# Patient Record
Sex: Female | Born: 1940
Health system: Southern US, Community
[De-identification: ages and names within clinical notes are randomized; demographics above are authoritative.]

## PROBLEM LIST (undated history)

## (undated) DIAGNOSIS — G4730 Sleep apnea, unspecified: Secondary | ICD-10-CM

## (undated) DIAGNOSIS — M1990 Unspecified osteoarthritis, unspecified site: Secondary | ICD-10-CM

## (undated) DIAGNOSIS — K219 Gastro-esophageal reflux disease without esophagitis: Secondary | ICD-10-CM

## (undated) DIAGNOSIS — I10 Essential (primary) hypertension: Secondary | ICD-10-CM

## (undated) DIAGNOSIS — K429 Umbilical hernia without obstruction or gangrene: Secondary | ICD-10-CM

## (undated) HISTORY — PX: ABDOMINAL HYSTERECTOMY: SHX81

## (undated) HISTORY — PX: EYE SURGERY: SHX253

---

## 1984-10-05 HISTORY — PX: FOOT SURGERY: SHX648

## 1998-01-28 ENCOUNTER — Other Ambulatory Visit: Admission: RE | Admit: 1998-01-28 | Discharge: 1998-01-28 | Payer: Self-pay | Admitting: Internal Medicine

## 1998-01-28 ENCOUNTER — Other Ambulatory Visit: Admission: RE | Admit: 1998-01-28 | Discharge: 1998-01-28 | Payer: Self-pay | Admitting: Urology

## 1998-03-07 ENCOUNTER — Ambulatory Visit (HOSPITAL_COMMUNITY): Admission: RE | Admit: 1998-03-07 | Discharge: 1998-03-07 | Payer: Self-pay | Admitting: Gastroenterology

## 1998-03-28 ENCOUNTER — Ambulatory Visit (HOSPITAL_COMMUNITY): Admission: RE | Admit: 1998-03-28 | Discharge: 1998-03-28 | Payer: Self-pay | Admitting: Gastroenterology

## 1999-01-09 ENCOUNTER — Ambulatory Visit (HOSPITAL_COMMUNITY): Admission: RE | Admit: 1999-01-09 | Discharge: 1999-01-09 | Payer: Self-pay | Admitting: Gastroenterology

## 2000-05-25 ENCOUNTER — Encounter: Admission: RE | Admit: 2000-05-25 | Discharge: 2000-05-25 | Payer: Self-pay | Admitting: Internal Medicine

## 2000-05-25 ENCOUNTER — Encounter: Payer: Self-pay | Admitting: Internal Medicine

## 2001-06-01 ENCOUNTER — Encounter: Payer: Self-pay | Admitting: Internal Medicine

## 2001-06-01 ENCOUNTER — Encounter: Admission: RE | Admit: 2001-06-01 | Discharge: 2001-06-01 | Payer: Self-pay | Admitting: Internal Medicine

## 2002-11-06 ENCOUNTER — Encounter: Admission: RE | Admit: 2002-11-06 | Discharge: 2002-11-06 | Payer: Self-pay | Admitting: Internal Medicine

## 2002-11-06 ENCOUNTER — Encounter: Payer: Self-pay | Admitting: Internal Medicine

## 2003-12-03 ENCOUNTER — Encounter: Admission: RE | Admit: 2003-12-03 | Discharge: 2003-12-03 | Payer: Self-pay | Admitting: Internal Medicine

## 2003-12-07 ENCOUNTER — Encounter: Admission: RE | Admit: 2003-12-07 | Discharge: 2003-12-07 | Payer: Self-pay | Admitting: Internal Medicine

## 2004-01-03 ENCOUNTER — Ambulatory Visit (HOSPITAL_COMMUNITY): Admission: RE | Admit: 2004-01-03 | Discharge: 2004-01-03 | Payer: Self-pay | Admitting: Gastroenterology

## 2005-02-09 ENCOUNTER — Encounter: Admission: RE | Admit: 2005-02-09 | Discharge: 2005-02-09 | Payer: Self-pay | Admitting: Obstetrics and Gynecology

## 2006-02-10 ENCOUNTER — Encounter: Admission: RE | Admit: 2006-02-10 | Discharge: 2006-02-10 | Payer: Self-pay | Admitting: Internal Medicine

## 2006-07-10 ENCOUNTER — Encounter: Admission: RE | Admit: 2006-07-10 | Discharge: 2006-07-10 | Payer: Self-pay | Admitting: Orthopedic Surgery

## 2006-11-10 ENCOUNTER — Ambulatory Visit: Payer: Self-pay | Admitting: Oncology

## 2006-12-15 ENCOUNTER — Encounter: Admission: RE | Admit: 2006-12-15 | Discharge: 2006-12-15 | Payer: Self-pay | Admitting: Internal Medicine

## 2007-03-21 ENCOUNTER — Ambulatory Visit: Payer: Self-pay | Admitting: Oncology

## 2007-03-23 LAB — CBC & DIFF AND RETIC
Basophils Absolute: 0.1 10*3/uL (ref 0.0–0.1)
Eosinophils Absolute: 0.1 10*3/uL (ref 0.0–0.5)
HGB: 15 g/dL (ref 11.6–15.9)
IRF: 0.3 (ref 0.130–0.330)
LYMPH%: 20.8 % (ref 14.0–48.0)
MCH: 33.2 pg (ref 26.0–34.0)
MCHC: 34.9 g/dL (ref 32.0–36.0)
MONO#: 0.5 10*3/uL (ref 0.1–0.9)
NEUT#: 7 10*3/uL — ABNORMAL HIGH (ref 1.5–6.5)
NEUT%: 71.6 % (ref 39.6–76.8)
Platelets: 442 10*3/uL — ABNORMAL HIGH (ref 145–400)
Retic %: 1 % (ref 0.4–2.3)
lymph#: 2 10*3/uL (ref 0.9–3.3)

## 2007-03-23 LAB — COMPREHENSIVE METABOLIC PANEL
Albumin: 4.7 g/dL (ref 3.5–5.2)
Alkaline Phosphatase: 68 U/L (ref 39–117)
BUN: 13 mg/dL (ref 6–23)
CO2: 26 mEq/L (ref 19–32)
Calcium: 9.6 mg/dL (ref 8.4–10.5)

## 2007-03-23 LAB — LACTATE DEHYDROGENASE: LDH: 151 U/L (ref 94–250)

## 2007-03-23 LAB — IRON AND TIBC
Iron: 58 ug/dL (ref 42–145)
TIBC: 294 ug/dL (ref 250–470)
UIBC: 236 ug/dL

## 2007-03-29 ENCOUNTER — Encounter: Admission: RE | Admit: 2007-03-29 | Discharge: 2007-03-29 | Payer: Self-pay | Admitting: Internal Medicine

## 2008-03-29 ENCOUNTER — Encounter: Admission: RE | Admit: 2008-03-29 | Discharge: 2008-03-29 | Payer: Self-pay | Admitting: Internal Medicine

## 2009-04-16 ENCOUNTER — Encounter: Admission: RE | Admit: 2009-04-16 | Discharge: 2009-04-16 | Payer: Self-pay | Admitting: Internal Medicine

## 2010-05-28 ENCOUNTER — Encounter: Admission: RE | Admit: 2010-05-28 | Discharge: 2010-05-28 | Payer: Self-pay | Admitting: Internal Medicine

## 2011-02-20 NOTE — Op Note (Signed)
NAME:  Rachel Ballard, Rachel Ballard                           ACCOUNT NO.:  [id number]   MEDICAL RECORD NO.:  [id number]                   PATIENT TYPE:  AMB   LOCATION:  ENDO                                 FACILITY:  [facility]   PHYSICIAN:  Danise Edge, M.D.                DATE OF BIRTH:  09/29/1941   DATE OF PROCEDURE:  01/03/2004  DATE OF DISCHARGE:                                 OPERATIVE REPORT   PROCEDURE:  Screening colonoscopy.   INDICATIONS FOR PROCEDURE:  [name] is a 70 year old female born  January 12, 1941.  Ms. Amster has a history of neoplastic colon polyps removed  colonoscopically.  She is due for a screening colonoscopy with polypectomy  to prevent colon cancer.   ENDOSCOPIST:  Danise Edge, M.D.   PREMEDICATION:  Versed 5 mg, Demerol 50 mg.   DESCRIPTION OF PROCEDURE:  After obtaining informed consent, Ms. Spruce was  placed in the left lateral decubitus position. I administered intravenous  Demerol and intravenous Versed to achieve conscious sedation for the  procedure. The patient's blood pressure, oxygen saturation and cardiac  rhythm were monitored throughout the procedure and documented in the medical  record.   Anal inspection was normal. Digital rectal exam was normal. The Olympus  adjustable pediatric colonoscope was introduced into the rectum and advanced  to the cecum. Colonic preparation for the exam today was excellent.   RECTUM:  Normal.   SIGMOID COLON AND DESCENDING COLON:  Normal.   SPLENIC FLEXURE:  Normal.   TRANSVERSE COLON:  Normal.   HEPATIC FLEXURE:  Normal.   ASCENDING COLON:  Normal.   CECUM AND ILEOCECAL VALVE:  Normal.   ASSESSMENT:  Normal screening proctocolonoscopy to the cecum.  No endoscopic  evidence for the presence of colorectal neoplasia.   RECOMMENDATIONS:  Repeat colonoscopy in five years.                                               Danise Edge, M.D.    MJ/MEDQ  D:  01/03/2004  T:  01/03/2004  Job:   [id number]   cc:   Ike Bene, M.D.  [address]  [place]  [place] [address]  Fax: [phone]

## 2011-06-26 ENCOUNTER — Other Ambulatory Visit: Payer: Self-pay | Admitting: Internal Medicine

## 2011-06-26 DIAGNOSIS — Z1231 Encounter for screening mammogram for malignant neoplasm of breast: Secondary | ICD-10-CM

## 2011-06-30 ENCOUNTER — Ambulatory Visit: Payer: Self-pay

## 2011-07-14 ENCOUNTER — Ambulatory Visit
Admission: RE | Admit: 2011-07-14 | Discharge: 2011-07-14 | Disposition: A | Discharge status: Home / Self Care | Payer: BLUE CROSS/BLUE SHIELD | Source: Ambulatory Visit | Attending: Internal Medicine | Admitting: Internal Medicine

## 2011-07-14 DIAGNOSIS — Z1231 Encounter for screening mammogram for malignant neoplasm of breast: Secondary | ICD-10-CM

## 2011-07-15 ENCOUNTER — Ambulatory Visit: Payer: Self-pay

## 2011-11-03 DIAGNOSIS — R7989 Other specified abnormal findings of blood chemistry: Secondary | ICD-10-CM | POA: Diagnosis not present

## 2011-12-31 DIAGNOSIS — R7989 Other specified abnormal findings of blood chemistry: Secondary | ICD-10-CM | POA: Diagnosis not present

## 2012-03-24 DIAGNOSIS — H2640 Unspecified secondary cataract: Secondary | ICD-10-CM | POA: Diagnosis not present

## 2012-03-24 DIAGNOSIS — Z961 Presence of intraocular lens: Secondary | ICD-10-CM | POA: Diagnosis not present

## 2012-03-24 DIAGNOSIS — D485 Neoplasm of uncertain behavior of skin: Secondary | ICD-10-CM | POA: Diagnosis not present

## 2012-03-24 DIAGNOSIS — H259 Unspecified age-related cataract: Secondary | ICD-10-CM | POA: Diagnosis not present

## 2012-04-15 DIAGNOSIS — M25519 Pain in unspecified shoulder: Secondary | ICD-10-CM | POA: Diagnosis not present

## 2012-04-15 DIAGNOSIS — I10 Essential (primary) hypertension: Secondary | ICD-10-CM | POA: Diagnosis not present

## 2012-04-15 DIAGNOSIS — L989 Disorder of the skin and subcutaneous tissue, unspecified: Secondary | ICD-10-CM | POA: Diagnosis not present

## 2012-04-15 DIAGNOSIS — E663 Overweight: Secondary | ICD-10-CM | POA: Diagnosis not present

## 2012-04-15 DIAGNOSIS — G4730 Sleep apnea, unspecified: Secondary | ICD-10-CM | POA: Diagnosis not present

## 2012-04-26 DIAGNOSIS — L820 Inflamed seborrheic keratosis: Secondary | ICD-10-CM | POA: Diagnosis not present

## 2012-04-26 DIAGNOSIS — D485 Neoplasm of uncertain behavior of skin: Secondary | ICD-10-CM | POA: Diagnosis not present

## 2012-05-16 DIAGNOSIS — L821 Other seborrheic keratosis: Secondary | ICD-10-CM | POA: Diagnosis not present

## 2012-05-16 DIAGNOSIS — L820 Inflamed seborrheic keratosis: Secondary | ICD-10-CM | POA: Diagnosis not present

## 2012-05-16 DIAGNOSIS — D485 Neoplasm of uncertain behavior of skin: Secondary | ICD-10-CM | POA: Diagnosis not present

## 2012-08-09 ENCOUNTER — Other Ambulatory Visit: Payer: Self-pay | Admitting: Internal Medicine

## 2012-08-09 DIAGNOSIS — Z1231 Encounter for screening mammogram for malignant neoplasm of breast: Secondary | ICD-10-CM

## 2012-09-12 ENCOUNTER — Ambulatory Visit
Admission: RE | Admit: 2012-09-12 | Discharge: 2012-09-12 | Disposition: A | Discharge status: Home / Self Care | Payer: Medicare Other | Source: Ambulatory Visit | Attending: Internal Medicine | Admitting: Internal Medicine

## 2012-09-12 ENCOUNTER — Ambulatory Visit: Payer: BLUE CROSS/BLUE SHIELD

## 2012-09-12 DIAGNOSIS — Z1231 Encounter for screening mammogram for malignant neoplasm of breast: Secondary | ICD-10-CM | POA: Diagnosis not present

## 2012-11-24 DIAGNOSIS — E7800 Pure hypercholesterolemia, unspecified: Secondary | ICD-10-CM | POA: Diagnosis not present

## 2012-11-24 DIAGNOSIS — Z0000 Encounter for general adult medical examination without abnormal findings: Secondary | ICD-10-CM | POA: Diagnosis not present

## 2012-11-24 DIAGNOSIS — G4730 Sleep apnea, unspecified: Secondary | ICD-10-CM | POA: Diagnosis not present

## 2012-11-24 DIAGNOSIS — I10 Essential (primary) hypertension: Secondary | ICD-10-CM | POA: Diagnosis not present

## 2013-03-21 DIAGNOSIS — H43819 Vitreous degeneration, unspecified eye: Secondary | ICD-10-CM | POA: Diagnosis not present

## 2013-03-21 DIAGNOSIS — H259 Unspecified age-related cataract: Secondary | ICD-10-CM | POA: Diagnosis not present

## 2013-03-21 DIAGNOSIS — H02839 Dermatochalasis of unspecified eye, unspecified eyelid: Secondary | ICD-10-CM | POA: Diagnosis not present

## 2013-03-21 DIAGNOSIS — H52209 Unspecified astigmatism, unspecified eye: Secondary | ICD-10-CM | POA: Diagnosis not present

## 2013-04-20 DIAGNOSIS — H2640 Unspecified secondary cataract: Secondary | ICD-10-CM | POA: Diagnosis not present

## 2013-04-20 DIAGNOSIS — H26499 Other secondary cataract, unspecified eye: Secondary | ICD-10-CM | POA: Diagnosis not present

## 2013-05-12 DIAGNOSIS — N390 Urinary tract infection, site not specified: Secondary | ICD-10-CM | POA: Diagnosis not present

## 2013-05-22 DIAGNOSIS — I10 Essential (primary) hypertension: Secondary | ICD-10-CM | POA: Diagnosis not present

## 2013-05-22 DIAGNOSIS — G4730 Sleep apnea, unspecified: Secondary | ICD-10-CM | POA: Diagnosis not present

## 2013-05-22 DIAGNOSIS — R350 Frequency of micturition: Secondary | ICD-10-CM | POA: Diagnosis not present

## 2013-09-18 DIAGNOSIS — H579 Unspecified disorder of eye and adnexa: Secondary | ICD-10-CM | POA: Diagnosis not present

## 2013-09-18 DIAGNOSIS — H01009 Unspecified blepharitis unspecified eye, unspecified eyelid: Secondary | ICD-10-CM | POA: Diagnosis not present

## 2013-09-18 DIAGNOSIS — H04129 Dry eye syndrome of unspecified lacrimal gland: Secondary | ICD-10-CM | POA: Diagnosis not present

## 2013-09-18 DIAGNOSIS — H16109 Unspecified superficial keratitis, unspecified eye: Secondary | ICD-10-CM | POA: Diagnosis not present

## 2013-10-12 ENCOUNTER — Other Ambulatory Visit: Payer: Self-pay

## 2013-10-12 DIAGNOSIS — Z1231 Encounter for screening mammogram for malignant neoplasm of breast: Secondary | ICD-10-CM

## 2013-11-02 ENCOUNTER — Ambulatory Visit
Admission: RE | Admit: 2013-11-02 | Discharge: 2013-11-02 | Disposition: A | Discharge status: Home / Self Care | Payer: Medicare Other | Source: Ambulatory Visit

## 2013-11-02 DIAGNOSIS — Z1231 Encounter for screening mammogram for malignant neoplasm of breast: Secondary | ICD-10-CM | POA: Diagnosis not present

## 2013-11-27 DIAGNOSIS — G4730 Sleep apnea, unspecified: Secondary | ICD-10-CM | POA: Diagnosis not present

## 2013-11-27 DIAGNOSIS — I10 Essential (primary) hypertension: Secondary | ICD-10-CM | POA: Diagnosis not present

## 2013-11-27 DIAGNOSIS — Z0000 Encounter for general adult medical examination without abnormal findings: Secondary | ICD-10-CM | POA: Diagnosis not present

## 2013-11-27 DIAGNOSIS — E2839 Other primary ovarian failure: Secondary | ICD-10-CM | POA: Diagnosis not present

## 2013-11-27 DIAGNOSIS — K5900 Constipation, unspecified: Secondary | ICD-10-CM | POA: Diagnosis not present

## 2013-11-27 DIAGNOSIS — E785 Hyperlipidemia, unspecified: Secondary | ICD-10-CM | POA: Diagnosis not present

## 2013-12-13 DIAGNOSIS — E2839 Other primary ovarian failure: Secondary | ICD-10-CM | POA: Diagnosis not present

## 2013-12-13 DIAGNOSIS — M949 Disorder of cartilage, unspecified: Secondary | ICD-10-CM | POA: Diagnosis not present

## 2013-12-13 DIAGNOSIS — M899 Disorder of bone, unspecified: Secondary | ICD-10-CM | POA: Diagnosis not present

## 2013-12-27 DIAGNOSIS — E7800 Pure hypercholesterolemia, unspecified: Secondary | ICD-10-CM | POA: Diagnosis not present

## 2014-05-07 ENCOUNTER — Other Ambulatory Visit: Payer: Self-pay | Admitting: Gastroenterology

## 2014-09-03 ENCOUNTER — Encounter (HOSPITAL_COMMUNITY): Payer: Self-pay | Admitting: *Deleted

## 2014-09-17 ENCOUNTER — Other Ambulatory Visit: Payer: Self-pay | Admitting: Gastroenterology

## 2014-09-18 ENCOUNTER — Ambulatory Visit (HOSPITAL_COMMUNITY): Payer: Medicare Other | Admitting: Anesthesiology

## 2014-09-18 ENCOUNTER — Encounter (HOSPITAL_COMMUNITY)
Admission: RE | Disposition: A | Discharge status: Home / Self Care | Payer: Self-pay | Source: Ambulatory Visit | Attending: Gastroenterology

## 2014-09-18 ENCOUNTER — Ambulatory Visit (HOSPITAL_COMMUNITY)
Admission: RE | Admit: 2014-09-18 | Discharge: 2014-09-18 | Disposition: A | Discharge status: Home / Self Care | Payer: Medicare Other | Source: Ambulatory Visit | Attending: Gastroenterology | Admitting: Gastroenterology

## 2014-09-18 ENCOUNTER — Encounter (HOSPITAL_COMMUNITY): Payer: Self-pay

## 2014-09-18 DIAGNOSIS — E780 Pure hypercholesterolemia: Secondary | ICD-10-CM | POA: Insufficient documentation

## 2014-09-18 DIAGNOSIS — D126 Benign neoplasm of colon, unspecified: Secondary | ICD-10-CM | POA: Diagnosis not present

## 2014-09-18 DIAGNOSIS — I10 Essential (primary) hypertension: Secondary | ICD-10-CM | POA: Insufficient documentation

## 2014-09-18 DIAGNOSIS — M1990 Unspecified osteoarthritis, unspecified site: Secondary | ICD-10-CM | POA: Insufficient documentation

## 2014-09-18 DIAGNOSIS — Z1211 Encounter for screening for malignant neoplasm of colon: Secondary | ICD-10-CM | POA: Diagnosis not present

## 2014-09-18 DIAGNOSIS — G4733 Obstructive sleep apnea (adult) (pediatric): Secondary | ICD-10-CM | POA: Insufficient documentation

## 2014-09-18 DIAGNOSIS — Z86010 Personal history of colonic polyps: Secondary | ICD-10-CM | POA: Diagnosis not present

## 2014-09-18 DIAGNOSIS — D125 Benign neoplasm of sigmoid colon: Secondary | ICD-10-CM | POA: Insufficient documentation

## 2014-09-18 DIAGNOSIS — K219 Gastro-esophageal reflux disease without esophagitis: Secondary | ICD-10-CM | POA: Insufficient documentation

## 2014-09-18 DIAGNOSIS — Z09 Encounter for follow-up examination after completed treatment for conditions other than malignant neoplasm: Secondary | ICD-10-CM | POA: Diagnosis present

## 2014-09-18 HISTORY — DX: Unspecified osteoarthritis, unspecified site: M19.90

## 2014-09-18 HISTORY — PX: COLONOSCOPY WITH PROPOFOL: SHX5780

## 2014-09-18 HISTORY — DX: Gastro-esophageal reflux disease without esophagitis: K21.9

## 2014-09-18 HISTORY — DX: Umbilical hernia without obstruction or gangrene: K42.9

## 2014-09-18 HISTORY — DX: Essential (primary) hypertension: I10

## 2014-09-18 HISTORY — DX: Sleep apnea, unspecified: G47.30

## 2014-09-18 SURGERY — COLONOSCOPY WITH PROPOFOL
Anesthesia: Monitor Anesthesia Care

## 2014-09-18 MED ORDER — LIDOCAINE HCL (CARDIAC) 20 MG/ML IV SOLN
INTRAVENOUS | Status: AC
  Filled 2014-09-18: qty 5

## 2014-09-18 MED ORDER — PROPOFOL INFUSION 10 MG/ML OPTIME
INTRAVENOUS | Status: DC | PRN
  Administered 2014-09-18: 120 ug/kg/min via INTRAVENOUS

## 2014-09-18 MED ORDER — PROPOFOL 10 MG/ML IV BOLUS
INTRAVENOUS | Status: AC
  Filled 2014-09-18: qty 20

## 2014-09-18 MED ORDER — LIDOCAINE HCL (CARDIAC) 20 MG/ML IV SOLN
INTRAVENOUS | Status: DC | PRN
  Administered 2014-09-18: 50 mg via INTRAVENOUS

## 2014-09-18 MED ORDER — PROPOFOL 10 MG/ML IV BOLUS
INTRAVENOUS | Status: DC | PRN
  Administered 2014-09-18: 20 mg via INTRAVENOUS
  Administered 2014-09-18: 50 mg via INTRAVENOUS

## 2014-09-18 MED ORDER — LACTATED RINGERS IV SOLN
INTRAVENOUS | Status: DC
  Administered 2014-09-18: 1000 mL via INTRAVENOUS

## 2014-09-18 SURGICAL SUPPLY — 22 items

## 2014-09-18 NOTE — H&P (Signed)
  Procedure: Surveillance colonoscopy. 05/02/2009 colonoscopy with removal of 3 small adenomatous colon polyps  History: The patient is a 73 year old female born July 11, 1941. She is scheduled to undergo a surveillance colonoscopy with polypectomy to prevent colon cancer.  Medication allergies: Penicillin. Mycin drugs.  Past medical history: Hypertension. Hypercholesterolemia. Obstructive sleep apnea syndrome. Urticaria. Cataracts. Hysterectomy. Cataract surgery. Right foot surgery. Intestinal surgery as a child. Rectocele surgery.  Exam: The patient is alert and lying comfortably on the endoscopy stretcher. Abdomen is soft and nontender to palpation. Lungs are clear to auscultation. Cardiac exam reveals a regular rhythm.  Plan: Proceed with surveillance colonoscopy

## 2014-09-18 NOTE — Anesthesia Postprocedure Evaluation (Signed)
Anesthesia Post Note  Patient: Rachel Ballard  Procedure(s) Performed: Procedure(s) (LRB): COLONOSCOPY WITH PROPOFOL (N/A)  Anesthesia type: MAC  Patient location: PACU  Post pain: Pain level controlled  Post assessment: Post-op Vital signs reviewed  Last Vitals: BP 142/76 mmHg  Pulse 73  Temp(Src) 36.7 C (Oral)  Resp 15  SpO2 99%  Post vital signs: Reviewed  Level of consciousness: awake  Complications: No apparent anesthesia complications

## 2014-09-18 NOTE — Anesthesia Preprocedure Evaluation (Addendum)
Anesthesia Evaluation  Patient identified by MRN, date of birth, ID band Patient awake    Reviewed: Allergy & Precautions, H&P , NPO status , Patient's Chart, lab work & pertinent test results  Airway Mallampati: II  TM Distance: >3 FB Neck ROM: Full    Dental no notable dental hx.    Pulmonary sleep apnea ,  breath sounds clear to auscultation  Pulmonary exam normal       Cardiovascular hypertension, Pt. on medications Rhythm:Regular Rate:Normal     Neuro/Psych negative neurological ROS  negative psych ROS   GI/Hepatic Neg liver ROS, GERD-  ,  Endo/Other  negative endocrine ROS  Renal/GU negative Renal ROS     Musculoskeletal  (+) Arthritis -,   Abdominal   Peds  Hematology negative hematology ROS (+)   Anesthesia Other Findings   Reproductive/Obstetrics negative OB ROS                            Anesthesia Physical Anesthesia Plan  ASA: II  Anesthesia Plan: MAC   Post-op Pain Management:    Induction: Intravenous  Airway Management Planned:   Additional Equipment:   Intra-op Plan:   Post-operative Plan:   Informed Consent: I have reviewed the patients History and Physical, chart, labs and discussed the procedure including the risks, benefits and alternatives for the proposed anesthesia with the patient or authorized representative who has indicated his/her understanding and acceptance.   Dental advisory given  Plan Discussed with: CRNA  Anesthesia Plan Comments:         Anesthesia Quick Evaluation

## 2014-09-18 NOTE — Transfer of Care (Signed)
Immediate Anesthesia Transfer of Care Note  Patient: Rachel Ballard  Procedure(s) Performed: Procedure(s): COLONOSCOPY WITH PROPOFOL (N/A)  Patient Location: PACU  Anesthesia Type:MAC  Level of Consciousness: awake, alert  and oriented  Airway & Oxygen Therapy: Patient Spontanous Breathing and Patient connected to face mask oxygen  Post-op Assessment: Report given to PACU RN and Post -op Vital signs reviewed and stable  Post vital signs: Reviewed and stable  Complications: No apparent anesthesia complications

## 2014-09-18 NOTE — Op Note (Signed)
Procedure: Surveillance colonoscopy. 05/02/2009 colonoscopy with removal of 3 small adenomatous colon polyps  Endoscopist: Earle Gell  Premedication: Propofol administered by anesthesia  Procedure: The patient was placed in the left lateral decubitus position. Anal inspection and digital rectal exam were normal. The Pentax pediatric colonoscope was introduced into the rectum and advanced to the cecum. A normal-appearing appendiceal orifice was identified. A normal-appearing ileocecal valve was intubated and the terminal ileum inspected. Colonic preparation for the exam today was good. Withdrawal time was 11 minutes  Rectum. Normal. Retroflex view of the distal rectum normal  Sigmoid colon. From the distal sigmoid colon, a 3 mm sessile polyp was removed with the cold biopsy forceps  Descending colon. Normal  Splenic flexure. Normal  Transverse colon. Normal  Hepatic flexure. Normal  Ascending colon. Normal  Cecum and ileocecal valve. Normal  Terminal ileum. Normal  Assessment: From the distal sigmoid colon, a 3 mm sessile polyp was removed with the cold biopsy forceps; otherwise normal surveillance colonoscopy.  Recommendation: Repeat surveillance colonoscopy is not recommended

## 2014-09-19 ENCOUNTER — Encounter (HOSPITAL_COMMUNITY): Payer: Self-pay | Admitting: Gastroenterology

## 2014-09-20 DIAGNOSIS — H2513 Age-related nuclear cataract, bilateral: Secondary | ICD-10-CM | POA: Diagnosis not present

## 2014-09-20 DIAGNOSIS — H01004 Unspecified blepharitis left upper eyelid: Secondary | ICD-10-CM | POA: Diagnosis not present

## 2014-09-20 DIAGNOSIS — H04123 Dry eye syndrome of bilateral lacrimal glands: Secondary | ICD-10-CM | POA: Diagnosis not present

## 2014-09-20 DIAGNOSIS — H01001 Unspecified blepharitis right upper eyelid: Secondary | ICD-10-CM | POA: Diagnosis not present

## 2014-11-29 DIAGNOSIS — Z23 Encounter for immunization: Secondary | ICD-10-CM | POA: Diagnosis not present

## 2014-11-29 DIAGNOSIS — Z6832 Body mass index (BMI) 32.0-32.9, adult: Secondary | ICD-10-CM | POA: Diagnosis not present

## 2014-11-29 DIAGNOSIS — I10 Essential (primary) hypertension: Secondary | ICD-10-CM | POA: Diagnosis not present

## 2014-11-29 DIAGNOSIS — G4730 Sleep apnea, unspecified: Secondary | ICD-10-CM | POA: Diagnosis not present

## 2014-11-29 DIAGNOSIS — Z0000 Encounter for general adult medical examination without abnormal findings: Secondary | ICD-10-CM | POA: Diagnosis not present

## 2014-11-29 DIAGNOSIS — E785 Hyperlipidemia, unspecified: Secondary | ICD-10-CM | POA: Diagnosis not present

## 2014-11-29 DIAGNOSIS — E663 Overweight: Secondary | ICD-10-CM | POA: Diagnosis not present

## 2014-11-29 DIAGNOSIS — Z1389 Encounter for screening for other disorder: Secondary | ICD-10-CM | POA: Diagnosis not present

## 2014-11-29 DIAGNOSIS — M79671 Pain in right foot: Secondary | ICD-10-CM | POA: Diagnosis not present

## 2014-12-27 DIAGNOSIS — Z8349 Family history of other endocrine, nutritional and metabolic diseases: Secondary | ICD-10-CM | POA: Diagnosis not present

## 2014-12-27 DIAGNOSIS — R740 Nonspecific elevation of levels of transaminase and lactic acid dehydrogenase [LDH]: Secondary | ICD-10-CM | POA: Diagnosis not present

## 2014-12-27 DIAGNOSIS — D473 Essential (hemorrhagic) thrombocythemia: Secondary | ICD-10-CM | POA: Diagnosis not present

## 2015-01-14 ENCOUNTER — Other Ambulatory Visit: Payer: Self-pay

## 2015-01-14 DIAGNOSIS — Z1231 Encounter for screening mammogram for malignant neoplasm of breast: Secondary | ICD-10-CM

## 2015-01-24 ENCOUNTER — Ambulatory Visit
Admission: RE | Admit: 2015-01-24 | Discharge: 2015-01-24 | Disposition: A | Discharge status: Home / Self Care | Payer: Medicare Other | Source: Ambulatory Visit

## 2015-01-24 DIAGNOSIS — Z1231 Encounter for screening mammogram for malignant neoplasm of breast: Secondary | ICD-10-CM

## 2015-01-29 DIAGNOSIS — R740 Nonspecific elevation of levels of transaminase and lactic acid dehydrogenase [LDH]: Secondary | ICD-10-CM | POA: Diagnosis not present

## 2015-03-28 DIAGNOSIS — N390 Urinary tract infection, site not specified: Secondary | ICD-10-CM | POA: Diagnosis not present

## 2015-03-28 DIAGNOSIS — R309 Painful micturition, unspecified: Secondary | ICD-10-CM | POA: Diagnosis not present

## 2015-05-14 DIAGNOSIS — L918 Other hypertrophic disorders of the skin: Secondary | ICD-10-CM | POA: Diagnosis not present

## 2015-05-14 DIAGNOSIS — L820 Inflamed seborrheic keratosis: Secondary | ICD-10-CM | POA: Diagnosis not present

## 2015-05-14 DIAGNOSIS — L821 Other seborrheic keratosis: Secondary | ICD-10-CM | POA: Diagnosis not present

## 2015-05-14 DIAGNOSIS — L309 Dermatitis, unspecified: Secondary | ICD-10-CM | POA: Diagnosis not present

## 2015-05-14 DIAGNOSIS — D1801 Hemangioma of skin and subcutaneous tissue: Secondary | ICD-10-CM | POA: Diagnosis not present

## 2015-05-22 DIAGNOSIS — M19071 Primary osteoarthritis, right ankle and foot: Secondary | ICD-10-CM | POA: Diagnosis not present

## 2015-05-22 DIAGNOSIS — M2041 Other hammer toe(s) (acquired), right foot: Secondary | ICD-10-CM | POA: Diagnosis not present

## 2015-05-23 DIAGNOSIS — H01001 Unspecified blepharitis right upper eyelid: Secondary | ICD-10-CM | POA: Diagnosis not present

## 2015-05-23 DIAGNOSIS — Z0100 Encounter for examination of eyes and vision without abnormal findings: Secondary | ICD-10-CM | POA: Diagnosis not present

## 2015-05-23 DIAGNOSIS — H25813 Combined forms of age-related cataract, bilateral: Secondary | ICD-10-CM | POA: Diagnosis not present

## 2015-05-23 DIAGNOSIS — H3531 Nonexudative age-related macular degeneration: Secondary | ICD-10-CM | POA: Diagnosis not present

## 2015-06-25 DIAGNOSIS — H25011 Cortical age-related cataract, right eye: Secondary | ICD-10-CM | POA: Diagnosis not present

## 2015-06-25 DIAGNOSIS — H25811 Combined forms of age-related cataract, right eye: Secondary | ICD-10-CM | POA: Diagnosis not present

## 2015-06-25 DIAGNOSIS — H2511 Age-related nuclear cataract, right eye: Secondary | ICD-10-CM | POA: Diagnosis not present

## 2015-11-28 DIAGNOSIS — R509 Fever, unspecified: Secondary | ICD-10-CM | POA: Diagnosis not present

## 2015-11-28 DIAGNOSIS — J101 Influenza due to other identified influenza virus with other respiratory manifestations: Secondary | ICD-10-CM | POA: Diagnosis not present

## 2015-12-05 DIAGNOSIS — E7800 Pure hypercholesterolemia, unspecified: Secondary | ICD-10-CM | POA: Diagnosis not present

## 2015-12-05 DIAGNOSIS — G4730 Sleep apnea, unspecified: Secondary | ICD-10-CM | POA: Diagnosis not present

## 2015-12-05 DIAGNOSIS — R945 Abnormal results of liver function studies: Secondary | ICD-10-CM | POA: Diagnosis not present

## 2015-12-05 DIAGNOSIS — Z0000 Encounter for general adult medical examination without abnormal findings: Secondary | ICD-10-CM | POA: Diagnosis not present

## 2015-12-05 DIAGNOSIS — I10 Essential (primary) hypertension: Secondary | ICD-10-CM | POA: Diagnosis not present

## 2016-02-11 ENCOUNTER — Other Ambulatory Visit: Payer: Self-pay

## 2016-02-11 DIAGNOSIS — Z1231 Encounter for screening mammogram for malignant neoplasm of breast: Secondary | ICD-10-CM

## 2016-02-12 ENCOUNTER — Ambulatory Visit
Admission: RE | Admit: 2016-02-12 | Discharge: 2016-02-12 | Disposition: A | Discharge status: Home / Self Care | Payer: Medicare Other | Source: Ambulatory Visit

## 2016-02-12 DIAGNOSIS — Z1231 Encounter for screening mammogram for malignant neoplasm of breast: Secondary | ICD-10-CM

## 2016-03-04 ENCOUNTER — Ambulatory Visit (INDEPENDENT_AMBULATORY_CARE_PROVIDER_SITE_OTHER): Payer: Medicare Other | Admitting: Podiatry

## 2016-03-04 VITALS — BP 164/89 | HR 87 | Resp 14 | Ht 61.0 in | Wt 175.0 lb

## 2016-03-04 DIAGNOSIS — L84 Corns and callosities: Secondary | ICD-10-CM

## 2016-03-04 DIAGNOSIS — L600 Ingrowing nail: Secondary | ICD-10-CM

## 2016-03-04 DIAGNOSIS — M779 Enthesopathy, unspecified: Secondary | ICD-10-CM

## 2016-03-04 NOTE — Progress Notes (Signed)
   Subjective:    Patient ID: Rachel Ballard, female    DOB: 10/02/1941, 75 y.o.   MRN: [id number]  HPI My right great toe nail is growing into the corner and I have a callus on the ball of that toe also     Review of Systems  All other systems reviewed and are negative.      Objective:   Physical Exam        Assessment & Plan:

## 2016-03-06 NOTE — Progress Notes (Signed)
Subjective:     Patient ID: Rachel Ballard, female   DOB: Mar 13, 1941, 75 y.o.   MRN: [id number]  HPI patient presents with an incurvated right big toenail medial border and pain in the right first MPJ with callus that sore when pressed   Review of Systems  All other systems reviewed and are negative.      Objective:   Physical Exam  Constitutional: She is oriented to person, place, and time.  Cardiovascular: Intact distal pulses.   Musculoskeletal: Normal range of motion.  Neurological: She is oriented to person, place, and time.  Skin: Skin is warm.  Nursing note and vitals reviewed.  status neurovascular status intact muscle strength adequate range of motion within normal limits with patient found to have an incurvated right hallux medial border that's painful when pressed with lesion underneath the first metatarsal right that also can become tender at times     Assessment:     Ingrown toenail deformity right hallux medial border and lesion plantar aspect foot painful    Plan:     H&P and condition reviewed and at this time I went ahead and I've recommended correction of the medial corner explaining procedure and risk. Patient wants surgery and today I infiltrated the right hallux 60 Milligan's like Marcaine mixture remove the border exposed matrix and applied phenol 3 applications 30 seconds followed by alcohol lavage and sterile dressing. Debris did the plantar lesion with no iatrogenic bleeding and reappoint to recheck

## 2016-03-20 ENCOUNTER — Telehealth: Payer: Self-pay | Admitting: *Deleted

## 2016-03-20 NOTE — Telephone Encounter (Signed)
Called patient at [phone] (Home #) to check to see how they were doing from their ingrown toenail procedure that was performed on Wednesday, Mar 04, 2016. Pt stated, "Foot feels good and has no pain".

## 2016-03-25 DIAGNOSIS — Z961 Presence of intraocular lens: Secondary | ICD-10-CM | POA: Diagnosis not present

## 2016-03-25 DIAGNOSIS — H04123 Dry eye syndrome of bilateral lacrimal glands: Secondary | ICD-10-CM | POA: Diagnosis not present

## 2016-04-02 DIAGNOSIS — L821 Other seborrheic keratosis: Secondary | ICD-10-CM | POA: Diagnosis not present

## 2016-07-03 DIAGNOSIS — M25562 Pain in left knee: Secondary | ICD-10-CM | POA: Diagnosis not present

## 2016-07-09 ENCOUNTER — Ambulatory Visit (INDEPENDENT_AMBULATORY_CARE_PROVIDER_SITE_OTHER): Payer: Medicare Other | Admitting: Orthopedic Surgery

## 2016-07-09 DIAGNOSIS — M1711 Unilateral primary osteoarthritis, right knee: Secondary | ICD-10-CM | POA: Diagnosis not present

## 2016-07-09 DIAGNOSIS — M19071 Primary osteoarthritis, right ankle and foot: Secondary | ICD-10-CM

## 2016-08-06 ENCOUNTER — Ambulatory Visit (INDEPENDENT_AMBULATORY_CARE_PROVIDER_SITE_OTHER): Payer: Medicare Other | Admitting: Orthopedic Surgery

## 2016-08-06 DIAGNOSIS — M1711 Unilateral primary osteoarthritis, right knee: Secondary | ICD-10-CM

## 2016-08-06 DIAGNOSIS — M25511 Pain in right shoulder: Secondary | ICD-10-CM

## 2016-08-06 DIAGNOSIS — M25571 Pain in right ankle and joints of right foot: Secondary | ICD-10-CM

## 2016-08-06 DIAGNOSIS — G8929 Other chronic pain: Secondary | ICD-10-CM

## 2016-08-06 MED ORDER — LIDOCAINE HCL 1 % IJ SOLN
5.0000 mL | INTRAMUSCULAR | Status: AC | PRN
  Administered 2016-08-06: 5 mL

## 2016-08-06 MED ORDER — METHYLPREDNISOLONE ACETATE 40 MG/ML IJ SUSP
40.0000 mg | INTRAMUSCULAR | Status: AC | PRN
  Administered 2016-08-06: 40 mg via INTRA_ARTICULAR

## 2016-08-06 NOTE — Progress Notes (Signed)
Rachel MARRISON - 75 y.o. female MRN [id number]  Date of birth: 16-Sep-1941  Office Visit Note: Visit Date: 08/06/2016 PCP: Kandice Hams, MD Referred by: Seward Carol, MD  Subjective: Chief Complaint  Patient presents with  . Right Knee - Follow-up  . Right Foot - Follow-up  . Follow-up   HPI: Patient states injection helped for about 2 weeks, now pain is gradually coming back.  Right foot is about the same, no change.      ROS Otherwise per HPI.  Assessment & Plan: Visit Diagnoses:  1. Unilateral primary osteoarthritis, right knee   2. Chronic right shoulder pain   3. Pain in right ankle and joints of right foot     Plan: No additional findings.   Meds & Orders: No orders of the defined types were placed in this encounter.  No orders of the defined types were placed in this encounter.   Follow-up: Return if symptoms worsen or fail to improve.   Procedures: Large Joint Inj Date/Time: 08/06/2016 1:12 PM Performed by: DUDA, MARCUS V Authorized by: Newt Minion   Consent Given by:  Patient Site marked: the procedure site was marked   Timeout: prior to procedure the correct patient, procedure, and site was verified   Indications:  Pain and diagnostic evaluation Location:  Knee Needle Size:  22 G Needle Length:  1.5 inches Approach:  Anteromedial Ultrasound Guidance: No   Fluoroscopic Guidance: No   Arthrogram: No Medications:  5 mL lidocaine 1 %; 40 mg methylPREDNISolone acetate 40 MG/ML Aspiration Attempted: No   Patient tolerance:  Patient tolerated the procedure well with no immediate complications    No notes on file   Clinical History: No specialty comments available.  She reports that she has never smoked. She does not have any smokeless tobacco history on file. No results for input(s): HGBA1C, LABURIC in the last 8760 hours.  Objective:  VS:  HT:    WT:   BMI:     BP:   HR: bpm  TEMP: ( )  RESP:  Physical Exam patient is alert oriented no  adenopathy well-dressed normal affect normal respiratory effort she does have an antalgic gait. Examination patient has a varus alignment to both knees worse on the right than the left she has crepitation with range of motion the right knee tender to palpation of the medial lateral joint line) cruciate are stable. Patient has decreased range of motion of the right ankle and subtalar joint status post open reduction internal fixation of calcaneus fracture. With chronic arthritis of the ankle and foot. Patient also has impingement symptoms of the right shoulder with decreased range of motion pain with Neer and Hawkins impingement test. Ortho Exam Imaging: No results found.  Past Medical/Family/Surgical/Social History: Medications & Allergies reviewed per EMR There are no active problems to display for this patient.  Past Medical History:  Diagnosis Date  . Arthritis   . GERD (gastroesophageal reflux disease)   . Hypertension   . Sleep apnea   . Umbilical hernia    No family history on file. Past Surgical History:  Procedure Laterality Date  . ABDOMINAL HYSTERECTOMY     partial  . COLONOSCOPY WITH PROPOFOL N/A 09/18/2014   Procedure: COLONOSCOPY WITH PROPOFOL;  Surgeon: Garlan Fair, MD;  Location: [facility];  Service: Endoscopy;  Laterality: N/A;  . EYE SURGERY     cataract surgery left eye with lens implant   Social History   Occupational History  .  Not on file.   Social History Main Topics  . Smoking status: Never Smoker  . Smokeless tobacco: Not on file  . Alcohol use No  . Drug use: No  . Sexual activity: Not on file

## 2016-08-13 DIAGNOSIS — N816 Rectocele: Secondary | ICD-10-CM | POA: Diagnosis not present

## 2016-08-13 DIAGNOSIS — R300 Dysuria: Secondary | ICD-10-CM | POA: Diagnosis not present

## 2016-08-31 DIAGNOSIS — H04123 Dry eye syndrome of bilateral lacrimal glands: Secondary | ICD-10-CM | POA: Diagnosis not present

## 2016-08-31 DIAGNOSIS — H52203 Unspecified astigmatism, bilateral: Secondary | ICD-10-CM | POA: Diagnosis not present

## 2016-08-31 DIAGNOSIS — H02831 Dermatochalasis of right upper eyelid: Secondary | ICD-10-CM | POA: Diagnosis not present

## 2016-08-31 DIAGNOSIS — H02834 Dermatochalasis of left upper eyelid: Secondary | ICD-10-CM | POA: Diagnosis not present

## 2016-10-28 DIAGNOSIS — K645 Perianal venous thrombosis: Secondary | ICD-10-CM | POA: Diagnosis not present

## 2016-10-29 DIAGNOSIS — K645 Perianal venous thrombosis: Secondary | ICD-10-CM | POA: Diagnosis not present

## 2016-12-08 DIAGNOSIS — S50862A Insect bite (nonvenomous) of left forearm, initial encounter: Secondary | ICD-10-CM | POA: Diagnosis not present

## 2016-12-08 DIAGNOSIS — R21 Rash and other nonspecific skin eruption: Secondary | ICD-10-CM | POA: Diagnosis not present

## 2016-12-21 DIAGNOSIS — M25561 Pain in right knee: Secondary | ICD-10-CM | POA: Diagnosis not present

## 2016-12-21 DIAGNOSIS — Z1389 Encounter for screening for other disorder: Secondary | ICD-10-CM | POA: Diagnosis not present

## 2016-12-21 DIAGNOSIS — I10 Essential (primary) hypertension: Secondary | ICD-10-CM | POA: Diagnosis not present

## 2016-12-21 DIAGNOSIS — G4730 Sleep apnea, unspecified: Secondary | ICD-10-CM | POA: Diagnosis not present

## 2016-12-21 DIAGNOSIS — E7800 Pure hypercholesterolemia, unspecified: Secondary | ICD-10-CM | POA: Diagnosis not present

## 2016-12-21 DIAGNOSIS — Z0000 Encounter for general adult medical examination without abnormal findings: Secondary | ICD-10-CM | POA: Diagnosis not present

## 2016-12-21 DIAGNOSIS — M8588 Other specified disorders of bone density and structure, other site: Secondary | ICD-10-CM | POA: Diagnosis not present

## 2017-01-28 DIAGNOSIS — M8588 Other specified disorders of bone density and structure, other site: Secondary | ICD-10-CM | POA: Diagnosis not present

## 2017-03-10 ENCOUNTER — Ambulatory Visit (INDEPENDENT_AMBULATORY_CARE_PROVIDER_SITE_OTHER): Payer: Medicare Other | Admitting: Family

## 2017-03-10 ENCOUNTER — Encounter (INDEPENDENT_AMBULATORY_CARE_PROVIDER_SITE_OTHER): Payer: Self-pay | Admitting: Family

## 2017-03-10 VITALS — Ht 61.0 in | Wt 175.0 lb

## 2017-03-10 DIAGNOSIS — M25571 Pain in right ankle and joints of right foot: Secondary | ICD-10-CM | POA: Diagnosis not present

## 2017-03-10 DIAGNOSIS — M1711 Unilateral primary osteoarthritis, right knee: Secondary | ICD-10-CM | POA: Diagnosis not present

## 2017-03-10 MED ORDER — DICLOFENAC SODIUM 1.5 % TD SOLN: 2.0000 mL | Freq: Three times a day (TID) | TRANSDERMAL | 1 refills | Status: DC | PRN

## 2017-03-10 MED ORDER — METHYLPREDNISOLONE ACETATE 40 MG/ML IJ SUSP
40.0000 mg | INTRAMUSCULAR | Status: AC | PRN
  Administered 2017-03-10: 40 mg via INTRA_ARTICULAR

## 2017-03-10 MED ORDER — LIDOCAINE HCL 1 % IJ SOLN
5.0000 mL | INTRAMUSCULAR | Status: AC | PRN
  Administered 2017-03-10: 5 mL

## 2017-03-10 NOTE — Progress Notes (Signed)
Rachel Ballard - 76 y.o. female MRN [id number]  Date of birth: 08-22-1941  Office Visit Note: Visit Date: 03/10/2017 PCP: Seward Carol, MD Referred by: Seward Carol, MD  Subjective: Chief Complaint  Patient presents with  . Right Leg - Pain   HPI: The patient is a 76 year old woman who presents today complaining of right ankle and right knee pain. She complains any swelling. Complains Him him of lateral knee and calf pain as well. Complaining of pain in the popliteal fossa. She has tried cortisone injections in the past. The last injection provided her with modest relief this lasted 3-4 weeks. She has increased her activities this spring and has had increased pain. States the knee pain is hindering her ability to do her activities of daily living. Is interested in discussing hyaluronic acid injections.  The ankle pain is chronic, is status post open reduction internal fixation of calcaneus fracture with chronic arthritis of the ankle and foot. Complains of loss of numbess in lateral forefoot 3-5 metatarsal heads.    ROS negative for fever or chills. Otherwise per HPI.  Assessment & Plan: Visit Diagnoses:  1. Primary osteoarthritis of right knee   2. Pain in joint involving right ankle and foot     Plan: No additional findings.  Depomedrol injection right knee. Will continue with orthotics and conservative measures for right foot and ankle. Will order monovisc for knee. Follow up in office with Dr. Sharol Given for further issues with knee.   Meds & Orders:  Meds ordered this encounter  Medications  . Diclofenac Sodium 1.5 % SOLN    Sig: Place 2 mLs onto the skin 3 (three) times daily as needed.    Dispense:  1 Bottle    Refill:  1   No orders of the defined types were placed in this encounter.   Follow-up: Return if symptoms worsen or fail to improve.   Procedures: Large Joint Inj Date/Time: 03/10/2017 10:01 AM Performed by: Suzan Slick Authorized by: Dondra Prader R    Consent Given by:  Patient Site marked: the procedure site was marked   Timeout: prior to procedure the correct patient, procedure, and site was verified   Indications:  Pain and diagnostic evaluation Location:  Knee Site:  R knee Needle Size:  22 G Needle Length:  1.5 inches Ultrasound Guidance: No   Fluoroscopic Guidance: No   Arthrogram: No   Medications:  5 mL lidocaine 1 %; 40 mg methylPREDNISolone acetate 40 MG/ML Aspiration Attempted: No   Patient tolerance:  Patient tolerated the procedure well with no immediate complications    No notes on file   Clinical History: No specialty comments available.  She reports that she has never smoked. She has never used smokeless tobacco. No results for input(s): HGBA1C, LABURIC in the last 8760 hours.  Objective:  VS:  HT:5\' 1"  (154.9 cm)   WT:175 lb (79.4 kg)  BMI:33.1    BP:   HR: bpm  TEMP: ( )  RESP:  Physical Exam patient is alert oriented no adenopathy well-dressed normal affect normal respiratory effort she does have an antalgic gait. Examination patient has a varus alignment to both knees worse on the right than the left she has crepitation with range of motion the right knee tender to palpation of the medial joint line. Collaterals and cruciates are stable. Patient has decreased range of motion of the right ankle and subtalar joint. No swelling. Anterior joint line tender.   Ortho Exam Imaging: No  results found.  Past Medical/Family/Surgical/Social History: Medications & Allergies reviewed per EMR There are no active problems to display for this patient.  Past Medical History:  Diagnosis Date  . Arthritis   . GERD (gastroesophageal reflux disease)   . Hypertension   . Sleep apnea   . Umbilical hernia    No family history on file. Past Surgical History:  Procedure Laterality Date  . ABDOMINAL HYSTERECTOMY     partial  . COLONOSCOPY WITH PROPOFOL N/A 09/18/2014   Procedure: COLONOSCOPY WITH PROPOFOL;   Surgeon: Garlan Fair, MD;  Location: [facility];  Service: Endoscopy;  Laterality: N/A;  . EYE SURGERY     cataract surgery left eye with lens implant   Social History   Occupational History  . Not on file.   Social History Main Topics  . Smoking status: Never Smoker  . Smokeless tobacco: Never Used  . Alcohol use No  . Drug use: No  . Sexual activity: Not on file

## 2017-03-10 NOTE — Addendum Note (Signed)
Addended by: Dondra Prader R on: 03/10/2017 10:48 AM   Modules accepted: Orders

## 2017-03-11 ENCOUNTER — Telehealth (INDEPENDENT_AMBULATORY_CARE_PROVIDER_SITE_OTHER): Payer: Self-pay

## 2017-03-11 NOTE — Telephone Encounter (Signed)
Submitted prior auth to synvisc for right knee injection

## 2017-03-15 ENCOUNTER — Ambulatory Visit (INDEPENDENT_AMBULATORY_CARE_PROVIDER_SITE_OTHER): Payer: Medicare Other | Admitting: Orthopedic Surgery

## 2017-03-15 ENCOUNTER — Encounter (INDEPENDENT_AMBULATORY_CARE_PROVIDER_SITE_OTHER): Payer: Self-pay | Admitting: Orthopedic Surgery

## 2017-03-15 ENCOUNTER — Ambulatory Visit (INDEPENDENT_AMBULATORY_CARE_PROVIDER_SITE_OTHER): Payer: Medicare Other

## 2017-03-15 VITALS — Ht 61.0 in | Wt 175.0 lb

## 2017-03-15 DIAGNOSIS — M19071 Primary osteoarthritis, right ankle and foot: Secondary | ICD-10-CM

## 2017-03-15 DIAGNOSIS — M1711 Unilateral primary osteoarthritis, right knee: Secondary | ICD-10-CM | POA: Diagnosis not present

## 2017-03-15 NOTE — Progress Notes (Signed)
Office Visit Note   Patient: Rachel Ballard           Date of Birth: 1941-08-08           MRN: [id number] Visit Date: 03/15/2017              Requested by: Seward Carol, MD [address] [place], [place] [address] PCP: Seward Carol, MD  Chief Complaint  Patient presents with  . Right Leg - Pain  . Right Ankle - Pain  . Right Foot - Pain      HPI: Patient is status post steroid injection for the right knee. She states this is helped some but she still has symptoms and swelling in the popliteal fossa. Patient also complains of pain in the subtalar joint on the right foot with pain radiating proximally up the peroneal tendons. She is status post a calcaneal fracture with internal fixation and status post release of the peroneal tendons.  Assessment & Plan: Visit Diagnoses:  1. Arthritis of right subtalar joint   2. Primary osteoarthritis of right knee     Plan: We will follow up in 4 weeks and evaluate the right knee if she had good interval relief with the steroid injection we will plan to proceed with a hyaluronic acid injection for the right knee. Patient does have a letter flexed first ray which is placing increase weight over the lateral column causing pain over the lateral column and pain over the peroneal tendons. Her orthotic was cut out beneath the first metatarsal to unload the lateral column. Discussed that if this does not work we would need to consider subtalar fusion.  Follow-Up Instructions: Return in about 4 weeks (around 04/12/2017).   Ortho Exam  Patient is alert, oriented, no adenopathy, well-dressed, normal affect, normal respiratory effort. Examination patient does have an antalgic gait. She has a varus hindfoot which places increased pressure over the lateral column of the right foot and places pressure over the peroneal tendons. She does have peroneal tendon function and has a previous scar over the peroneal tendons. There is no palpable defect of  the peroneal tendons but she does have some tenderness to palpation at the musculotendinous junction. Patient has pain to palpation of the sinus Tarsi. She has essentially no subtalar motion. Patient's right knee does have a small amount of effusion and has tenderness to palpation of the medial lateral joint line. She has no tenderness to palpation in the calf no clinical signs of DVT.  Imaging: Xr Foot Complete Right  Result Date: 03/15/2017 Three-view radiographs of the right foot shows a previous staple for a calcaneal fracture. Patient has subtalar arthrosis with collapse of the posterior facet of the subtalar joint secondary to her calcaneal fracture.   Labs: No results found for: HGBA1C, ESRSEDRATE, CRP, LABURIC, REPTSTATUS, GRAMSTAIN, CULT, LABORGA  Orders:  Orders Placed This Encounter  Procedures  . XR Foot Complete Right   No orders of the defined types were placed in this encounter.    Procedures: No procedures performed  Clinical Data: No additional findings.  ROS:  All other systems negative, except as noted in the HPI. Review of Systems  Objective: Vital Signs: Ht 5\' 1"  (1.549 m)   Wt 175 lb (79.4 kg)   BMI 33.07 kg/m   Specialty Comments:  No specialty comments available.  PMFS History: Patient Active Problem List   Diagnosis Date Noted  . Arthritis of right subtalar joint 03/15/2017   Past Medical  History:  Diagnosis Date  . Arthritis   . GERD (gastroesophageal reflux disease)   . Hypertension   . Sleep apnea   . Umbilical hernia     History reviewed. No pertinent family history.  Past Surgical History:  Procedure Laterality Date  . ABDOMINAL HYSTERECTOMY     partial  . COLONOSCOPY WITH PROPOFOL N/A 09/18/2014   Procedure: COLONOSCOPY WITH PROPOFOL;  Surgeon: Garlan Fair, MD;  Location: [facility];  Service: Endoscopy;  Laterality: N/A;  . EYE SURGERY     cataract surgery left eye with lens implant   Social History   Occupational  History  . Not on file.   Social History Main Topics  . Smoking status: Never Smoker  . Smokeless tobacco: Never Used  . Alcohol use No  . Drug use: No  . Sexual activity: Not on file

## 2017-03-15 NOTE — Telephone Encounter (Signed)
Pt has been approved in the office today will discuss when she will have injection.

## 2017-03-16 ENCOUNTER — Ambulatory Visit (INDEPENDENT_AMBULATORY_CARE_PROVIDER_SITE_OTHER): Payer: Medicare Other | Admitting: Orthopedic Surgery

## 2017-03-19 ENCOUNTER — Other Ambulatory Visit: Payer: Self-pay | Admitting: Internal Medicine

## 2017-03-19 DIAGNOSIS — Z1231 Encounter for screening mammogram for malignant neoplasm of breast: Secondary | ICD-10-CM

## 2017-04-06 ENCOUNTER — Ambulatory Visit
Admission: RE | Admit: 2017-04-06 | Discharge: 2017-04-06 | Disposition: A | Discharge status: Home / Self Care | Payer: Medicare Other | Source: Ambulatory Visit | Attending: Internal Medicine | Admitting: Internal Medicine

## 2017-04-06 DIAGNOSIS — Z1231 Encounter for screening mammogram for malignant neoplasm of breast: Secondary | ICD-10-CM

## 2017-04-15 ENCOUNTER — Ambulatory Visit (INDEPENDENT_AMBULATORY_CARE_PROVIDER_SITE_OTHER): Payer: Medicare Other | Admitting: Orthopedic Surgery

## 2017-04-15 ENCOUNTER — Encounter (INDEPENDENT_AMBULATORY_CARE_PROVIDER_SITE_OTHER): Payer: Self-pay

## 2017-08-09 ENCOUNTER — Ambulatory Visit (INDEPENDENT_AMBULATORY_CARE_PROVIDER_SITE_OTHER): Payer: Medicare Other | Admitting: Orthopedic Surgery

## 2017-08-09 ENCOUNTER — Encounter (INDEPENDENT_AMBULATORY_CARE_PROVIDER_SITE_OTHER): Payer: Self-pay | Admitting: Orthopedic Surgery

## 2017-08-09 VITALS — Ht 61.0 in | Wt 175.0 lb

## 2017-08-09 DIAGNOSIS — M1711 Unilateral primary osteoarthritis, right knee: Secondary | ICD-10-CM | POA: Diagnosis not present

## 2017-08-09 MED ORDER — METHYLPREDNISOLONE ACETATE 40 MG/ML IJ SUSP
40.0000 mg | INTRAMUSCULAR | Status: AC | PRN
  Administered 2017-08-09: 40 mg via INTRA_ARTICULAR

## 2017-08-09 MED ORDER — LIDOCAINE HCL 1 % IJ SOLN
5.0000 mL | INTRAMUSCULAR | Status: AC | PRN
  Administered 2017-08-09: 5 mL

## 2017-08-09 NOTE — Progress Notes (Signed)
Office Visit Note   Patient: Rachel Ballard           Date of Birth: Dec 14, 1940           MRN: [id number] Visit Date: 08/09/2017              Requested by: Seward Carol, MD [address] [place], [place] [address] PCP: Seward Carol, MD  Chief Complaint  Patient presents with  . Right Knee - Pain      HPI: Patient presents in follow-up for osteoarthritis of her right knee.  She had an injection about 5 months ago which did help.  She was considering the possibility of a hyaluronic acid injection.  Patient states that recently she has been having increasing mechanical symptoms in her right knee.  Assessment & Plan: Visit Diagnoses:  1. Primary osteoarthritis of right knee     Plan: Right knee was injected from anterior medial portal she tolerated this well follow-up as needed.  Discussed that we could proceed with anti-inflammatories or steroid injection hyaluronic acid injection plasty.  Patient states she feels comfortable with proceeding with steroid injections.  Follow-Up Instructions: Return if symptoms worsen or fail to improve.   Ortho Exam  Patient is alert, oriented, no adenopathy, well-dressed, normal affect, normal respiratory effort. Examination patient has an antalgic gait she is tender to palpation over the medial and lateral joint line collaterals and cruciates are stable there is no effusion no redness no cellulitis.  There is crepitation on range of motion of the right knee.  Imaging: No results found. No images are attached to the encounter.  Labs: No results found for: HGBA1C, ESRSEDRATE, CRP, LABURIC, REPTSTATUS, GRAMSTAIN, CULT, LABORGA  Orders:  No orders of the defined types were placed in this encounter.  No orders of the defined types were placed in this encounter.    Procedures: Large Joint Inj: R knee on 08/09/2017 4:06 PM Indications: pain and diagnostic evaluation Details: 22 G 1.5 in needle, anteromedial  approach  Arthrogram: No  Medications: 5 mL lidocaine 1 %; 40 mg methylPREDNISolone acetate 40 MG/ML Outcome: tolerated well, no immediate complications Procedure, treatment alternatives, risks and benefits explained, specific risks discussed. Consent was given by the patient. Immediately prior to procedure a time out was called to verify the correct patient, procedure, equipment, support staff and site/side marked as required. Patient was prepped and draped in the usual sterile fashion.      Clinical Data: No additional findings.  ROS:  All other systems negative, except as noted in the HPI. Review of Systems  Objective: Vital Signs: Ht 5\' 1"  (1.549 m)   Wt 175 lb (79.4 kg)   BMI 33.07 kg/m   Specialty Comments:  No specialty comments available.  PMFS History: Patient Active Problem List   Diagnosis Date Noted  . Arthritis of right subtalar joint 03/15/2017   Past Medical History:  Diagnosis Date  . Arthritis   . GERD (gastroesophageal reflux disease)   . Hypertension   . Sleep apnea   . Umbilical hernia     History reviewed. No pertinent family history.  Past Surgical History:  Procedure Laterality Date  . ABDOMINAL HYSTERECTOMY     partial  . EYE SURGERY     cataract surgery left eye with lens implant   Social History   Occupational History  . Not on file  Tobacco Use  . Smoking status: Never Smoker  . Smokeless tobacco: Never Used  Substance and Sexual  Activity  . Alcohol use: No  . Drug use: No  . Sexual activity: Not on file

## 2017-08-13 DIAGNOSIS — H43813 Vitreous degeneration, bilateral: Secondary | ICD-10-CM | POA: Diagnosis not present

## 2017-08-13 DIAGNOSIS — H52203 Unspecified astigmatism, bilateral: Secondary | ICD-10-CM | POA: Diagnosis not present

## 2017-08-13 DIAGNOSIS — H02831 Dermatochalasis of right upper eyelid: Secondary | ICD-10-CM | POA: Diagnosis not present

## 2017-08-13 DIAGNOSIS — H04123 Dry eye syndrome of bilateral lacrimal glands: Secondary | ICD-10-CM | POA: Diagnosis not present

## 2017-11-22 ENCOUNTER — Encounter (INDEPENDENT_AMBULATORY_CARE_PROVIDER_SITE_OTHER): Payer: Self-pay | Admitting: Orthopedic Surgery

## 2017-11-22 ENCOUNTER — Ambulatory Visit (INDEPENDENT_AMBULATORY_CARE_PROVIDER_SITE_OTHER): Payer: Medicare Other | Admitting: Orthopedic Surgery

## 2017-11-22 VITALS — Ht 61.0 in | Wt 175.0 lb

## 2017-11-22 DIAGNOSIS — M1711 Unilateral primary osteoarthritis, right knee: Secondary | ICD-10-CM | POA: Diagnosis not present

## 2017-11-22 MED ORDER — METHYLPREDNISOLONE ACETATE 40 MG/ML IJ SUSP
40.0000 mg | INTRAMUSCULAR | Status: AC | PRN
  Administered 2017-11-22: 40 mg via INTRA_ARTICULAR

## 2017-11-22 MED ORDER — LIDOCAINE HCL 1 % IJ SOLN
5.0000 mL | INTRAMUSCULAR | Status: AC | PRN
  Administered 2017-11-22: 5 mL

## 2017-11-22 NOTE — Progress Notes (Signed)
Office Visit Note   Patient: Rachel Ballard           Date of Birth: [birth year]           MRN: [id number] Visit Date: 11/22/2017              Requested by: Seward Carol, MD [address] [place], [place] [address] PCP: Seward Carol, MD  Chief Complaint  Patient presents with  . Right Knee - Follow-up    S/p injection 08/09/17      HPI: Patient is a 77 year old woman who presents follow-up for osteoarthritis of her right knee.  She states she did get about 3 weeks of relief from her previous injection.  Patient inquires regarding hyaluronic acid injection single versus multiple injections versus steroid injections versus knee replacement.  Assessment & Plan: Visit Diagnoses:  1. Primary osteoarthritis of right knee     Plan: Discussed that we could proceed with steroid injections about 3-5-year.  Discussed that hyaluronic acid is an injection that comes either in single or multiple viral doses they all are very similar in their efficacy and that we could set her up for a hyaluronic acid injection if she would like to pursue this.  Recommended the importance of exercising joining a gym and strengthening her knee muscles.  Follow-Up Instructions: Return if symptoms worsen or fail to improve.   Ortho Exam  Patient is alert, oriented, no adenopathy, well-dressed, normal affect, normal respiratory effort. Examination patient has an antalgic gait she lacks about 10 degrees to full extension on the right there is crepitation with range of motion of the right knee collaterals and cruciates are stable there is no effusion there is no redness no cellulitis.  She is primarily tender to palpation over the medial joint line.  There are no mechanical symptoms.  Imaging: No results found. No images are attached to the encounter.  Labs: No results found for: HGBA1C, ESRSEDRATE, CRP, LABURIC, REPTSTATUS, GRAMSTAIN, CULT, LABORGA  @LABSALLVALUES (HGBA1)@  Body mass index is  33.07 kg/m.  Orders:  No orders of the defined types were placed in this encounter.  No orders of the defined types were placed in this encounter.    Procedures: Large Joint Inj: R knee on 11/22/2017 3:00 PM Indications: pain and diagnostic evaluation Details: 22 G 1.5 in needle, anteromedial approach  Arthrogram: No  Medications: 5 mL lidocaine 1 %; 40 mg methylPREDNISolone acetate 40 MG/ML Outcome: tolerated well, no immediate complications Procedure, treatment alternatives, risks and benefits explained, specific risks discussed. Consent was given by the patient. Immediately prior to procedure a time out was called to verify the correct patient, procedure, equipment, support staff and site/side marked as required. Patient was prepped and draped in the usual sterile fashion.      Clinical Data: No additional findings.  ROS:  All other systems negative, except as noted in the HPI. Review of Systems  Objective: Vital Signs: Ht 5\' 1"  (1.549 m)   Wt 175 lb (79.4 kg)   BMI 33.07 kg/m   Specialty Comments:  No specialty comments available.  PMFS History: Patient Active Problem List   Diagnosis Date Noted  . Arthritis of right subtalar joint 03/15/2017   Past Medical History:  Diagnosis Date  . Arthritis   . GERD (gastroesophageal reflux disease)   . Hypertension   . Sleep apnea   . Umbilical hernia     History reviewed. No pertinent family history.  Past Surgical History:  Procedure Laterality  Date  . ABDOMINAL HYSTERECTOMY     partial  . COLONOSCOPY WITH PROPOFOL N/A 09/18/2014   Procedure: COLONOSCOPY WITH PROPOFOL;  Surgeon: Garlan Fair, MD;  Location: [facility];  Service: Endoscopy;  Laterality: N/A;  . EYE SURGERY     cataract surgery left eye with lens implant   Social History   Occupational History  . Not on file  Tobacco Use  . Smoking status: Never Smoker  . Smokeless tobacco: Never Used  Substance and Sexual Activity  . Alcohol use:  No  . Drug use: No  . Sexual activity: Not on file

## 2017-12-23 DIAGNOSIS — M25561 Pain in right knee: Secondary | ICD-10-CM | POA: Diagnosis not present

## 2017-12-23 DIAGNOSIS — I10 Essential (primary) hypertension: Secondary | ICD-10-CM | POA: Diagnosis not present

## 2017-12-23 DIAGNOSIS — G4730 Sleep apnea, unspecified: Secondary | ICD-10-CM | POA: Diagnosis not present

## 2017-12-23 DIAGNOSIS — E7800 Pure hypercholesterolemia, unspecified: Secondary | ICD-10-CM | POA: Diagnosis not present

## 2017-12-23 DIAGNOSIS — Z1389 Encounter for screening for other disorder: Secondary | ICD-10-CM | POA: Diagnosis not present

## 2017-12-23 DIAGNOSIS — Z0000 Encounter for general adult medical examination without abnormal findings: Secondary | ICD-10-CM | POA: Diagnosis not present

## 2018-01-18 DIAGNOSIS — M1711 Unilateral primary osteoarthritis, right knee: Secondary | ICD-10-CM | POA: Diagnosis not present

## 2018-04-20 ENCOUNTER — Other Ambulatory Visit: Payer: Self-pay | Admitting: Orthopedic Surgery

## 2018-04-27 DIAGNOSIS — M25561 Pain in right knee: Secondary | ICD-10-CM | POA: Diagnosis not present

## 2018-04-27 DIAGNOSIS — I10 Essential (primary) hypertension: Secondary | ICD-10-CM | POA: Diagnosis not present

## 2018-04-27 DIAGNOSIS — K5900 Constipation, unspecified: Secondary | ICD-10-CM | POA: Diagnosis not present

## 2018-04-29 NOTE — Pre-Procedure Instructions (Signed)
ALPHONSINE MINIUM  04/29/2018      [id number] [address], [place] [address] Phone: [phone] Fax: [phone]    Your procedure is scheduled on Wednesday, Aug. 7th   Report to [facility] Admitting at 10:45 AM             (posted surgery 12:45p - 2:39p)   Call this number if you have problems the morning of surgery:  [phone]   Remember:   Do not eat any food or drink any liquids after midnight, Tuesday.                   4-5 days prior to surgery, STOP TAKING any Vitamins, Herbal Supplements, Anti-inflammatories, Aspirin   Take these medicines the morning of surgery with A SIP OF WATER - NOTHING    Do not wear jewelry, make-up or nail polish.  Do not wear lotions, powders, perfumes, or deodorant.  Do not shave 48 hours prior to surgery.   Do not bring valuables to the hospital.  [facility] is not responsible for any belongings or valuables.  Contacts, dentures or bridgework may not be worn into surgery.  Leave your suitcase in the car.  After surgery it may be brought to your room.  For patients admitted to the hospital, discharge time will be determined by your treatment team.  Please read over the following fact sheets that you were given. Pain Booklet, MRSA Information and Surgical Site Infection Prevention     [facility]- Preparing For Surgery  Before surgery, you can play an important role. Because skin is not sterile, your skin needs to be as free of germs as possible. You can reduce the number of germs on your skin by washing with CHG (chlorahexidine gluconate) Soap before surgery.  CHG is an antiseptic cleaner which kills germs and bonds with the skin to continue killing germs even after washing.    Oral Hygiene is also important to reduce your risk of infection.  Remember - BRUSH YOUR TEETH THE MORNING OF SURGERY WITH YOUR REGULAR TOOTHPASTE  Please do not use if you have an  allergy to CHG or antibacterial soaps. If your skin becomes reddened/irritated stop using the CHG.  Do not shave (including legs and underarms) for at least 48 hours prior to first CHG shower. It is OK to shave your face.  Please follow these instructions carefully.   1. Shower the NIGHT BEFORE SURGERY and the MORNING OF SURGERY with CHG.   2. If you chose to wash your hair, wash your hair first as usual with your normal shampoo.  3. After you shampoo, rinse your hair and body thoroughly to remove the shampoo.  4. Use CHG as you would any other liquid soap. You can apply CHG directly to the skin and wash gently with a scrungie or a clean washcloth.   5. Apply the CHG Soap to your body ONLY FROM THE NECK DOWN.  Do not use on open wounds or open sores. Avoid contact with your eyes, ears, mouth and genitals (private parts). Wash Face and genitals (private parts)  with your normal soap.  6. Wash thoroughly, paying special attention to the area where your surgery will be performed.  7. Thoroughly rinse your body with warm water from the neck down.  8. DO NOT shower/wash with your normal soap after using and rinsing off the CHG Soap.  9. Pat yourself  dry with a CLEAN TOWEL.  10. Wear CLEAN PAJAMAS to bed the night before surgery, wear comfortable clothes the morning of surgery  11. Place CLEAN SHEETS on your bed the night of your first shower and DO NOT SLEEP WITH PETS.    Day of Surgery:  Do not apply any deodorants/lotions.  Please wear clean clothes to the hospital/surgery center.   Remember to brush your teeth WITH YOUR REGULAR TOOTHPASTE.

## 2018-04-29 NOTE — Pre-Procedure Instructions (Signed)
VIDA NICOL  04/29/2018      [id number] [address], [place] [address] Phone: [phone] Fax: [phone]    Your procedure is scheduled on Wednesday, Aug. 7th   Report to [facility] Admitting at 10:45 AM             (posted surgery 12:45p - 2:39p)   Call this number if you have problems the morning of surgery:  [phone]   Remember:   Do not eat any food or drink any liquids after midnight, Tuesday.                   4-5 days prior to surgery, STOP TAKING any Vitamins, Herbal Supplements, Anti-inflammatories, Aspirin   Take these medicines the morning of surgery with A SIP OF WATER - NOTHING    Do not wear jewelry, make-up or nail polish.  Do not wear lotions, powders, perfumes, or deodorant.  Do not shave 48 hours prior to surgery.   Do not bring valuables to the hospital.  [facility] is not responsible for any belongings or valuables.  Contacts, dentures or bridgework may not be worn into surgery.  Leave your suitcase in the car.  After surgery it may be brought to your room.  For patients admitted to the hospital, discharge time will be determined by your treatment team.  Please read over the following fact sheets that you were given. Pain Booklet, MRSA Information and Surgical Site Infection Prevention     [facility]- Preparing For Surgery  Before surgery, you can play an important role. Because skin is not sterile, your skin needs to be as free of germs as possible. You can reduce the number of germs on your skin by washing with CHG (chlorahexidine gluconate) Soap before surgery.  CHG is an antiseptic cleaner which kills germs and bonds with the skin to continue killing germs even after washing.    Oral Hygiene is also important to reduce your risk of infection.    Remember - BRUSH YOUR TEETH THE MORNING OF SURGERY WITH YOUR REGULAR TOOTHPASTE  Please do not use if you have  an allergy to CHG or antibacterial soaps. If your skin becomes reddened/irritated stop using the CHG.  Do not shave (including legs and underarms) for at least 48 hours prior to first CHG shower. It is OK to shave your face.  Please follow these instructions carefully.   1. Shower the NIGHT BEFORE SURGERY and the MORNING OF SURGERY with CHG.   2. If you chose to wash your hair, wash your hair first as usual with your normal shampoo.  3. After you shampoo, rinse your hair and body thoroughly to remove the shampoo.  4. Use CHG as you would any other liquid soap. You can apply CHG directly to the skin and wash gently with a scrungie or a clean washcloth.   5. Apply the CHG Soap to your body ONLY FROM THE NECK DOWN.  Do not use on open wounds or open sores. Avoid contact with your eyes, ears, mouth and genitals (private parts). Wash Face and genitals (private parts)  with your normal soap.  6. Wash thoroughly, paying special attention to the area where your surgery will be performed.  7. Thoroughly rinse your body with warm water from the neck down.  8. DO NOT shower/wash with your normal soap after using and rinsing off the CHG Soap.  9.  Pat yourself dry with a CLEAN TOWEL.  10. Wear CLEAN PAJAMAS to bed the night before surgery, wear comfortable clothes the morning of surgery  11. Place CLEAN SHEETS on your bed the night of your first shower and DO NOT SLEEP WITH PETS.  Day of Surgery:  Do not apply any deodorants/lotions.  Please wear clean clothes to the hospital/surgery center.    Remember to brush your teeth WITH YOUR REGULAR TOOTHPASTE.

## 2018-05-02 ENCOUNTER — Encounter (HOSPITAL_COMMUNITY)
Admission: RE | Admit: 2018-05-02 | Discharge: 2018-05-02 | Disposition: A | Discharge status: Home / Self Care | Payer: Medicare Other | Source: Ambulatory Visit | Attending: Orthopedic Surgery | Admitting: Orthopedic Surgery

## 2018-05-02 ENCOUNTER — Ambulatory Visit (HOSPITAL_COMMUNITY)
Admission: RE | Admit: 2018-05-02 | Discharge: 2018-05-02 | Disposition: A | Discharge status: Home / Self Care | Payer: Medicare Other | Source: Ambulatory Visit | Attending: Orthopedic Surgery | Admitting: Orthopedic Surgery

## 2018-05-02 ENCOUNTER — Other Ambulatory Visit: Payer: Self-pay

## 2018-05-02 ENCOUNTER — Encounter (HOSPITAL_COMMUNITY): Payer: Self-pay

## 2018-05-02 DIAGNOSIS — J984 Other disorders of lung: Secondary | ICD-10-CM | POA: Diagnosis not present

## 2018-05-02 DIAGNOSIS — Z01818 Encounter for other preprocedural examination: Secondary | ICD-10-CM | POA: Insufficient documentation

## 2018-05-02 DIAGNOSIS — Z01810 Encounter for preprocedural cardiovascular examination: Secondary | ICD-10-CM | POA: Insufficient documentation

## 2018-05-02 LAB — URINALYSIS, ROUTINE W REFLEX MICROSCOPIC
BILIRUBIN URINE: NEGATIVE
Glucose, UA: NEGATIVE mg/dL
Hgb urine dipstick: NEGATIVE
Ketones, ur: NEGATIVE mg/dL
Nitrite: NEGATIVE
Protein, ur: NEGATIVE mg/dL
Specific Gravity, Urine: 1.005 (ref 1.005–1.030)
pH: 7 (ref 5.0–8.0)

## 2018-05-02 LAB — CBC WITH DIFFERENTIAL/PLATELET
Abs Immature Granulocytes: 0.2 10*3/uL — ABNORMAL HIGH (ref 0.0–0.1)
BASOS PCT: 0 %
Basophils Absolute: 0 10*3/uL (ref 0.0–0.1)
EOS ABS: 0.1 10*3/uL (ref 0.0–0.7)
Eosinophils Relative: 1 %
HCT: 46.6 % — ABNORMAL HIGH (ref 36.0–46.0)
Hemoglobin: 15.1 g/dL — ABNORMAL HIGH (ref 12.0–15.0)
IMMATURE GRANULOCYTES: 2 %
Lymphocytes Relative: 23 %
Lymphs Abs: 1.6 10*3/uL (ref 0.7–4.0)
MCH: 32.6 pg (ref 26.0–34.0)
MCHC: 32.4 g/dL (ref 30.0–36.0)
MCV: 100.6 fL — ABNORMAL HIGH (ref 78.0–100.0)
Monocytes Absolute: 0.5 10*3/uL (ref 0.1–1.0)
Monocytes Relative: 7 %
NEUTROS PCT: 67 %
Neutro Abs: 4.7 10*3/uL (ref 1.7–7.7)
PLATELETS: 331 10*3/uL (ref 150–400)
RBC: 4.63 MIL/uL (ref 3.87–5.11)
RDW: 13 % (ref 11.5–15.5)
WBC: 7.1 10*3/uL (ref 4.0–10.5)

## 2018-05-02 LAB — BASIC METABOLIC PANEL
Anion gap: 5 (ref 5–15)
BUN: 9 mg/dL (ref 8–23)
CO2: 29 mmol/L (ref 22–32)
Calcium: 9.2 mg/dL (ref 8.9–10.3)
Chloride: 108 mmol/L (ref 98–111)
Creatinine, Ser: 0.56 mg/dL (ref 0.44–1.00)
GFR calc non Af Amer: >60 mL/min (ref >60)
Glucose, Bld: 91 mg/dL (ref 70–99)
Potassium: 4 mmol/L (ref 3.5–5.1)
Sodium: 142 mmol/L (ref 135–145)

## 2018-05-02 LAB — TYPE AND SCREEN
ABO/RH(D): O POS
Antibody Screen: NEGATIVE

## 2018-05-02 LAB — PROTIME-INR
INR: 0.97
Prothrombin Time: 12.7 seconds (ref 11.4–15.2)

## 2018-05-02 LAB — APTT: aPTT: 33 seconds (ref 24–36)

## 2018-05-02 LAB — SURGICAL PCR SCREEN
MRSA, PCR: NEGATIVE
Staphylococcus aureus: NEGATIVE

## 2018-05-02 LAB — ABO/RH: ABO/RH(D): O POS

## 2018-05-02 NOTE — Progress Notes (Addendum)
PCP - Dr. Seward Carol Cardiologist - denies  Chest x-ray - 05/02/18 EKG - 05/02/18 Stress Test - denies ECHO - denies Cardiac Cath - denies  CPAP -uses nightly; will bring mask DOS   Aspirin Instructions: Called Dr. Mayer Camel office, Sandi Raveling was out of the office. Spoke with receptionist who will notify Juliann Pulse to call patient regarding ASA instructions.   Anesthesia review: No  Patient denies shortness of breath, fever, cough and chest pain at PAT appointment   Patient verbalized understanding of instructions that were given to them at the PAT appointment. Patient was also instructed that they will need to review over the PAT instructions again at home before surgery.

## 2018-05-02 NOTE — Progress Notes (Signed)
Called and left a message with surgical coordinator Sandi Raveling at Dr. Damita Dunnings office regarding patient's abnormal UA.   Jacqlyn Larsen, RN

## 2018-05-05 ENCOUNTER — Other Ambulatory Visit: Payer: Self-pay | Admitting: Orthopedic Surgery

## 2018-05-05 NOTE — Care Plan (Signed)
Spoke with patient prior to surgery. Will discharge to home with family and following:   DME:  [facility] - ordered from [facility] to be delivered to room  HHPT: [facility] for 5 visits  OPPT: [facility] 05/24/18 following MD visit  MD follow up:  Dr Mayer Camel - 05/24/18 @ 1015.    Please contact Ladell Heads, [place] with questions or if this plan should need to change   Thanks

## 2018-05-10 DIAGNOSIS — M1711 Unilateral primary osteoarthritis, right knee: Secondary | ICD-10-CM | POA: Diagnosis present

## 2018-05-10 MED ORDER — BUPIVACAINE LIPOSOME 1.3 % IJ SUSP
20.0000 mL | INTRAMUSCULAR | Status: AC
  Administered 2018-05-11: 20 mL
  Filled 2018-05-10: qty 20

## 2018-05-10 MED ORDER — TRANEXAMIC ACID 1000 MG/10ML IV SOLN
2000.0000 mg | INTRAVENOUS | Status: AC
  Administered 2018-05-11: 2000 mg via TOPICAL
  Filled 2018-05-10: qty 20

## 2018-05-10 MED ORDER — LACTATED RINGERS IV SOLN: INTRAVENOUS | Status: DC

## 2018-05-10 MED ORDER — VANCOMYCIN HCL IN DEXTROSE 1-5 GM/200ML-% IV SOLN
1000.0000 mg | INTRAVENOUS | Status: AC
  Administered 2018-05-11: 1000 mg via INTRAVENOUS
  Filled 2018-05-10: qty 200

## 2018-05-10 MED ORDER — TRANEXAMIC ACID 1000 MG/10ML IV SOLN
1000.0000 mg | INTRAVENOUS | Status: AC
  Administered 2018-05-11: 1000 mg via INTRAVENOUS
  Filled 2018-05-10: qty 1100

## 2018-05-10 NOTE — H&P (Signed)
TOTAL KNEE ADMISSION H&P  Patient is being admitted for right total knee arthroplasty.  Subjective:  Chief Complaint:right knee pain.  HPI: Rachel Ballard, 77 y.o. female, has a history of pain and functional disability in the right knee due to arthritis and has failed non-surgical conservative treatments for greater than 12 weeks to includeNSAID's and/or analgesics, flexibility and strengthening excercises, use of assistive devices, weight reduction as appropriate and activity modification.  Onset of symptoms was gradual, starting several years ago with gradually worsening course since that time. The patient noted no past surgery on the right knee(s).  Patient currently rates pain in the right knee(s) at 10 out of 10 with activity. Patient has night pain, worsening of pain with activity and weight bearing, pain that interferes with activities of daily living, pain with passive range of motion and crepitus.  Patient has evidence of joint space narrowing by imaging studies.   There is no active infection.  Patient Active Problem List   Diagnosis Date Noted  . Arthritis of right subtalar joint 03/15/2017   Past Medical History:  Diagnosis Date  . Arthritis   . GERD (gastroesophageal reflux disease)   . Hypertension   . Sleep apnea   . Umbilical hernia     Past Surgical History:  Procedure Laterality Date  . ABDOMINAL HYSTERECTOMY     partial  . COLONOSCOPY WITH PROPOFOL N/A 09/18/2014   Procedure: COLONOSCOPY WITH PROPOFOL;  Surgeon: Garlan Fair, MD;  Location: [facility];  Service: Endoscopy;  Laterality: N/A;  . EYE SURGERY     cataract surgery left eye with lens implant  . FOOT SURGERY Right 1986   crushed heel; from a car surgery.     No current facility-administered medications for this encounter.    Current Outpatient Medications  Medication Sig Dispense Refill Last Dose  . aspirin EC 81 MG tablet Take 81 mg by mouth at bedtime.    Taking  . Calcium Carbonate-Vitamin D  (CALCIUM + D PO) Take 1 tablet by mouth at bedtime.    Taking  . carboxymethylcellulose (REFRESH PLUS) 0.5 % SOLN Place 1 drop into both eyes daily.     . Carboxymethylcellulose Sodium (REFRESH LIQUIGEL) 1 % GEL Apply 1 application topically at bedtime.     . docusate sodium (COLACE) 100 MG capsule Take 100 mg by mouth 2 (two) times daily.     . Multiple Vitamin (MULTIVITAMIN WITH MINERALS) TABS tablet Take 1 tablet by mouth daily. Women's One-A-Day 50+   Taking  . naproxen sodium (ALEVE) 220 MG tablet Take 440 mg by mouth 2 (two) times daily as needed (for pain.).     Marland Kitchen Omega 3 1200 MG CAPS Take 1,200 mg by mouth 2 (two) times daily.    Taking  . polyethylene glycol (MIRALAX / GLYCOLAX) packet Take 17 g by mouth daily.     Marland Kitchen losartan (COZAAR) 25 MG tablet Take 25 mg by mouth daily.  2    Allergies  Allergen Reactions  . Penicillins Hives, Itching and Rash    Has patient had a PCN reaction causing immediate rash, facial/tongue/throat swelling, SOB or lightheadedness with hypotension: Unknown Has patient had a PCN reaction causing severe rash involving mucus membranes or skin necrosis: Unknown Has patient had a PCN reaction that required hospitalization: No Has patient had a PCN reaction occurring within the last 10 years: No If all of the above answers are "NO", then may proceed with Cephalosporin use.   . Erythromycin Hives and  Rash    Social History   Tobacco Use  . Smoking status: Never Smoker  . Smokeless tobacco: Never Used  Substance Use Topics  . Alcohol use: No    No family history on file.   Review of Systems  Constitutional: Negative.   HENT: Negative.   Eyes: Negative.   Respiratory: Negative.   Cardiovascular:       HTN  Gastrointestinal: Positive for constipation.  Genitourinary: Negative.   Musculoskeletal: Positive for joint pain.  Skin: Negative.   Neurological: Negative.   Endo/Heme/Allergies: Negative.   Psychiatric/Behavioral: Negative.      Objective:  Physical Exam  Constitutional: She is oriented to person, place, and time. She appears well-developed and well-nourished.  HENT:  Head: Normocephalic and atraumatic.  Eyes: Pupils are equal, round, and reactive to light.  Neck: Normal range of motion.  Cardiovascular: Intact distal pulses.  Respiratory: Effort normal.  Musculoskeletal: She exhibits tenderness.  the patient has good strength good range of motion in the left knee.  The patient's right knee does have tenderness over the medial and lateral joint lines.  She has a range from roughly 5 to 100.  No instability.  Obvious crepitus with range of motion.  Calves are soft and nontender.    Neurological: She is alert and oriented to person, place, and time.  Skin: Skin is warm and dry.  Psychiatric: She has a normal mood and affect. Her behavior is normal. Judgment and thought content normal.    Vital signs in last 24 hours:    Labs:   Estimated body mass index is 29.93 kg/m as calculated from the following:   Height as of 05/02/18: 5\' 1"  (1.549 m).   Weight as of 05/02/18: 71.8 kg (158 lb 6.4 oz).   Imaging Review Plain radiographs demonstrate bilateral AP weightbearing, bilateral Rosenberg, lateral sunrise views of the right knee are taken and reviewed in office today.  This shows near end-stage arthritis lateral compartment and moderate to severe patellofemoral arthritis.   Preoperative templating of the joint replacement has been completed, documented, and submitted to the Operating Room personnel in order to optimize intra-operative equipment management.   Anticipated LOS equal to or greater than 2 midnights due to - Age 25 and older with one or more of the following:   - Expected need for hospital services (PT, OT, Nursing) required for safe discharge   Assessment/Plan:  End stage arthritis, right knee   The patient history, physical examination, clinical judgment of the provider and imaging  studies are consistent with end stage degenerative joint disease of the right knee(s) and total knee arthroplasty is deemed medically necessary. The treatment options including medical management, injection therapy arthroscopy and arthroplasty were discussed at length. The risks and benefits of total knee arthroplasty were presented and reviewed. The risks due to aseptic loosening, infection, stiffness, patella tracking problems, thromboembolic complications and other imponderables were discussed. The patient acknowledged the explanation, agreed to proceed with the plan and consent was signed. Patient is being admitted for inpatient treatment for surgery, pain control, PT, OT, prophylactic antibiotics, VTE prophylaxis, progressive ambulation and ADL's and discharge planning. The patient is planning to be discharged home with home health services.

## 2018-05-11 ENCOUNTER — Other Ambulatory Visit: Payer: Self-pay

## 2018-05-11 ENCOUNTER — Inpatient Hospital Stay (HOSPITAL_COMMUNITY)
Admission: RE | Admit: 2018-05-11 | Discharge: 2018-05-13 | DRG: 470 | Disposition: A | Discharge status: Home Health | Payer: Medicare Other | Attending: Orthopedic Surgery | Admitting: Orthopedic Surgery

## 2018-05-11 ENCOUNTER — Encounter (HOSPITAL_COMMUNITY): Payer: Self-pay | Admitting: Surgery

## 2018-05-11 ENCOUNTER — Inpatient Hospital Stay (HOSPITAL_COMMUNITY): Payer: Medicare Other | Admitting: Anesthesiology

## 2018-05-11 ENCOUNTER — Encounter (HOSPITAL_COMMUNITY)
Admission: RE | Disposition: A | Discharge status: Home Health | Payer: Self-pay | Source: Ambulatory Visit | Attending: Orthopedic Surgery

## 2018-05-11 DIAGNOSIS — I10 Essential (primary) hypertension: Secondary | ICD-10-CM | POA: Diagnosis present

## 2018-05-11 DIAGNOSIS — Z90710 Acquired absence of both cervix and uterus: Secondary | ICD-10-CM

## 2018-05-11 DIAGNOSIS — K5900 Constipation, unspecified: Secondary | ICD-10-CM | POA: Diagnosis present

## 2018-05-11 DIAGNOSIS — G4730 Sleep apnea, unspecified: Secondary | ICD-10-CM | POA: Diagnosis present

## 2018-05-11 DIAGNOSIS — K219 Gastro-esophageal reflux disease without esophagitis: Secondary | ICD-10-CM | POA: Diagnosis present

## 2018-05-11 DIAGNOSIS — Z7982 Long term (current) use of aspirin: Secondary | ICD-10-CM

## 2018-05-11 DIAGNOSIS — E669 Obesity, unspecified: Secondary | ICD-10-CM | POA: Diagnosis present

## 2018-05-11 DIAGNOSIS — M1711 Unilateral primary osteoarthritis, right knee: Secondary | ICD-10-CM | POA: Diagnosis present

## 2018-05-11 DIAGNOSIS — G4733 Obstructive sleep apnea (adult) (pediatric): Secondary | ICD-10-CM | POA: Diagnosis not present

## 2018-05-11 DIAGNOSIS — Z6829 Body mass index (BMI) 29.0-29.9, adult: Secondary | ICD-10-CM | POA: Diagnosis not present

## 2018-05-11 DIAGNOSIS — G8918 Other acute postprocedural pain: Secondary | ICD-10-CM | POA: Diagnosis not present

## 2018-05-11 HISTORY — PX: TOTAL KNEE ARTHROPLASTY: SHX125

## 2018-05-11 SURGERY — ARTHROPLASTY, KNEE, TOTAL
Anesthesia: Spinal | Site: Knee | Laterality: Right

## 2018-05-11 MED ORDER — ZOLPIDEM TARTRATE 5 MG PO TABS: 5.0000 mg | ORAL_TABLET | Freq: Every evening | ORAL | Status: DC | PRN

## 2018-05-11 MED ORDER — CHLORHEXIDINE GLUCONATE 4 % EX LIQD: 60.0000 mL | Freq: Once | CUTANEOUS | Status: DC

## 2018-05-11 MED ORDER — PHENYLEPHRINE 40 MCG/ML (10ML) SYRINGE FOR IV PUSH (FOR BLOOD PRESSURE SUPPORT)
PREFILLED_SYRINGE | INTRAVENOUS | Status: DC | PRN
  Administered 2018-05-11 (×3): 80 ug via INTRAVENOUS

## 2018-05-11 MED ORDER — HYDROMORPHONE HCL 1 MG/ML IJ SOLN: 0.5000 mg | INTRAMUSCULAR | Status: DC | PRN

## 2018-05-11 MED ORDER — METOCLOPRAMIDE HCL 5 MG/ML IJ SOLN: 5.0000 mg | Freq: Three times a day (TID) | INTRAMUSCULAR | Status: DC | PRN

## 2018-05-11 MED ORDER — GABAPENTIN 300 MG PO CAPS
300.0000 mg | ORAL_CAPSULE | Freq: Three times a day (TID) | ORAL | Status: DC
  Administered 2018-05-11 – 2018-05-13 (×5): 300 mg via ORAL
  Filled 2018-05-11 (×5): qty 1

## 2018-05-11 MED ORDER — PROPOFOL 10 MG/ML IV BOLUS
INTRAVENOUS | Status: AC
  Filled 2018-05-11: qty 20

## 2018-05-11 MED ORDER — SODIUM CHLORIDE 0.9 % IJ SOLN
INTRAMUSCULAR | Status: DC | PRN
  Administered 2018-05-11: 30 mL

## 2018-05-11 MED ORDER — MIDAZOLAM HCL 2 MG/2ML IJ SOLN
1.0000 mg | Freq: Once | INTRAMUSCULAR | Status: AC
  Administered 2018-05-11: 1 mg via INTRAVENOUS

## 2018-05-11 MED ORDER — FENTANYL CITRATE (PF) 250 MCG/5ML IJ SOLN
INTRAMUSCULAR | Status: DC | PRN
  Administered 2018-05-11: 50 ug via INTRAVENOUS

## 2018-05-11 MED ORDER — METOCLOPRAMIDE HCL 5 MG PO TABS: 5.0000 mg | ORAL_TABLET | Freq: Three times a day (TID) | ORAL | Status: DC | PRN

## 2018-05-11 MED ORDER — SODIUM CHLORIDE 0.9 % IR SOLN
Status: DC | PRN
  Administered 2018-05-11: 1000 mL

## 2018-05-11 MED ORDER — KCL IN DEXTROSE-NACL 20-5-0.45 MEQ/L-%-% IV SOLN
INTRAVENOUS | Status: DC
  Administered 2018-05-11: 19:00:00 via INTRAVENOUS
  Filled 2018-05-11: qty 1000

## 2018-05-11 MED ORDER — CARBOXYMETHYLCELLULOSE SODIUM 1 % OP GEL: 1.0000 "application " | Freq: Every day | OPHTHALMIC | Status: DC

## 2018-05-11 MED ORDER — FENTANYL CITRATE (PF) 250 MCG/5ML IJ SOLN
INTRAMUSCULAR | Status: AC
  Filled 2018-05-11: qty 5

## 2018-05-11 MED ORDER — DEXAMETHASONE SODIUM PHOSPHATE 10 MG/ML IJ SOLN
INTRAMUSCULAR | Status: DC | PRN
  Administered 2018-05-11: 10 mg via INTRAVENOUS

## 2018-05-11 MED ORDER — ONDANSETRON HCL 4 MG PO TABS: 4.0000 mg | ORAL_TABLET | Freq: Four times a day (QID) | ORAL | Status: DC | PRN

## 2018-05-11 MED ORDER — PHENOL 1.4 % MT LIQD: 1.0000 | OROMUCOSAL | Status: DC | PRN

## 2018-05-11 MED ORDER — MEPERIDINE HCL 50 MG/ML IJ SOLN: 6.2500 mg | INTRAMUSCULAR | Status: DC | PRN

## 2018-05-11 MED ORDER — POLYETHYLENE GLYCOL 3350 17 G PO PACK
17.0000 g | PACK | Freq: Every day | ORAL | Status: DC | PRN
  Administered 2018-05-11 – 2018-05-12 (×3): 17 g via ORAL
  Filled 2018-05-11 (×3): qty 1

## 2018-05-11 MED ORDER — PROPOFOL 500 MG/50ML IV EMUL
INTRAVENOUS | Status: DC | PRN
  Administered 2018-05-11: 75 ug/kg/min via INTRAVENOUS

## 2018-05-11 MED ORDER — PANTOPRAZOLE SODIUM 40 MG PO TBEC
40.0000 mg | DELAYED_RELEASE_TABLET | Freq: Every day | ORAL | Status: DC
  Administered 2018-05-11 – 2018-05-13 (×3): 40 mg via ORAL
  Filled 2018-05-11 (×2): qty 1

## 2018-05-11 MED ORDER — METHOCARBAMOL 500 MG PO TABS
500.0000 mg | ORAL_TABLET | Freq: Four times a day (QID) | ORAL | Status: DC | PRN
  Administered 2018-05-11 – 2018-05-12 (×2): 500 mg via ORAL
  Filled 2018-05-11 (×2): qty 1

## 2018-05-11 MED ORDER — FENTANYL CITRATE (PF) 100 MCG/2ML IJ SOLN: 25.0000 ug | INTRAMUSCULAR | Status: DC | PRN

## 2018-05-11 MED ORDER — DIPHENHYDRAMINE HCL 12.5 MG/5ML PO ELIX
12.5000 mg | ORAL_SOLUTION | ORAL | Status: DC | PRN
  Administered 2018-05-11: 25 mg via ORAL
  Filled 2018-05-11: qty 10

## 2018-05-11 MED ORDER — DOCUSATE SODIUM 100 MG PO CAPS
100.0000 mg | ORAL_CAPSULE | Freq: Two times a day (BID) | ORAL | Status: DC
Start: 2018-05-11 — End: 2018-05-13
  Administered 2018-05-11 – 2018-05-13 (×4): 100 mg via ORAL
  Filled 2018-05-11 (×4): qty 1

## 2018-05-11 MED ORDER — LACTATED RINGERS IV SOLN
INTRAVENOUS | Status: DC | PRN
  Administered 2018-05-11 (×2): via INTRAVENOUS

## 2018-05-11 MED ORDER — LACTATED RINGERS IV SOLN: INTRAVENOUS | Status: DC

## 2018-05-11 MED ORDER — SODIUM CHLORIDE 0.9 % IV SOLN
INTRAVENOUS | Status: DC | PRN
  Administered 2018-05-11: 20 ug/min via INTRAVENOUS

## 2018-05-11 MED ORDER — ALUM & MAG HYDROXIDE-SIMETH 200-200-20 MG/5ML PO SUSP: 30.0000 mL | ORAL | Status: DC | PRN

## 2018-05-11 MED ORDER — ASPIRIN EC 81 MG PO TBEC: 81.0000 mg | DELAYED_RELEASE_TABLET | Freq: Two times a day (BID) | ORAL | 0 refills | Status: AC

## 2018-05-11 MED ORDER — PHENYLEPHRINE 40 MCG/ML (10ML) SYRINGE FOR IV PUSH (FOR BLOOD PRESSURE SUPPORT)
PREFILLED_SYRINGE | INTRAVENOUS | Status: AC
  Filled 2018-05-11: qty 10

## 2018-05-11 MED ORDER — OXYCODONE HCL 5 MG PO TABS
5.0000 mg | ORAL_TABLET | ORAL | Status: DC | PRN
  Administered 2018-05-12 – 2018-05-13 (×2): 5 mg via ORAL
  Filled 2018-05-11: qty 2
  Filled 2018-05-11 (×2): qty 1

## 2018-05-11 MED ORDER — TRANEXAMIC ACID 1000 MG/10ML IV SOLN
1000.0000 mg | Freq: Once | INTRAVENOUS | Status: AC
  Administered 2018-05-11: 1000 mg via INTRAVENOUS
  Filled 2018-05-11: qty 10

## 2018-05-11 MED ORDER — LIDOCAINE 2% (20 MG/ML) 5 ML SYRINGE
INTRAMUSCULAR | Status: AC
  Filled 2018-05-11: qty 5

## 2018-05-11 MED ORDER — ASPIRIN 81 MG PO CHEW
81.0000 mg | CHEWABLE_TABLET | Freq: Two times a day (BID) | ORAL | Status: DC
  Administered 2018-05-11 – 2018-05-13 (×4): 81 mg via ORAL
  Filled 2018-05-11 (×4): qty 1

## 2018-05-11 MED ORDER — ARTIFICIAL TEARS OPHTHALMIC OINT
TOPICAL_OINTMENT | Freq: Every day | OPHTHALMIC | Status: DC
  Administered 2018-05-11 – 2018-05-12 (×2): via OPHTHALMIC
  Filled 2018-05-11: qty 3.5

## 2018-05-11 MED ORDER — FENTANYL CITRATE (PF) 100 MCG/2ML IJ SOLN
25.0000 ug | Freq: Once | INTRAMUSCULAR | Status: AC
  Administered 2018-05-11: 25 ug via INTRAVENOUS

## 2018-05-11 MED ORDER — METHOCARBAMOL 1000 MG/10ML IJ SOLN
500.0000 mg | Freq: Four times a day (QID) | INTRAVENOUS | Status: DC | PRN
  Filled 2018-05-11: qty 5

## 2018-05-11 MED ORDER — LOSARTAN POTASSIUM 25 MG PO TABS
25.0000 mg | ORAL_TABLET | Freq: Every day | ORAL | Status: DC
  Administered 2018-05-11 – 2018-05-13 (×3): 25 mg via ORAL
  Filled 2018-05-11 (×3): qty 1

## 2018-05-11 MED ORDER — PROPOFOL 1000 MG/100ML IV EMUL
INTRAVENOUS | Status: AC
  Filled 2018-05-11: qty 100

## 2018-05-11 MED ORDER — FENTANYL CITRATE (PF) 100 MCG/2ML IJ SOLN
INTRAMUSCULAR | Status: AC
  Administered 2018-05-11: 25 ug via INTRAVENOUS
  Filled 2018-05-11: qty 2

## 2018-05-11 MED ORDER — ONDANSETRON HCL 4 MG/2ML IJ SOLN: 4.0000 mg | Freq: Four times a day (QID) | INTRAMUSCULAR | Status: DC | PRN

## 2018-05-11 MED ORDER — BISACODYL 5 MG PO TBEC
5.0000 mg | DELAYED_RELEASE_TABLET | Freq: Every day | ORAL | Status: DC | PRN
  Administered 2018-05-12 – 2018-05-13 (×2): 5 mg via ORAL
  Filled 2018-05-11 (×2): qty 1

## 2018-05-11 MED ORDER — DEXAMETHASONE SODIUM PHOSPHATE 10 MG/ML IJ SOLN
INTRAMUSCULAR | Status: AC
  Filled 2018-05-11: qty 1

## 2018-05-11 MED ORDER — TIZANIDINE HCL 2 MG PO TABS: 2.0000 mg | ORAL_TABLET | Freq: Four times a day (QID) | ORAL | 0 refills | Status: AC | PRN

## 2018-05-11 MED ORDER — BUPIVACAINE-EPINEPHRINE 0.25% -1:200000 IJ SOLN
INTRAMUSCULAR | Status: AC
  Filled 2018-05-11: qty 1

## 2018-05-11 MED ORDER — BUPIVACAINE-EPINEPHRINE (PF) 0.25% -1:200000 IJ SOLN
INTRAMUSCULAR | Status: DC | PRN
  Administered 2018-05-11: 30 mL via PERINEURAL

## 2018-05-11 MED ORDER — ROPIVACAINE HCL 7.5 MG/ML IJ SOLN
INTRAMUSCULAR | Status: DC | PRN
  Administered 2018-05-11: 20 mL via PERINEURAL

## 2018-05-11 MED ORDER — BUPIVACAINE IN DEXTROSE 0.75-8.25 % IT SOLN
INTRATHECAL | Status: DC | PRN
  Administered 2018-05-11: 2 mL via INTRATHECAL

## 2018-05-11 MED ORDER — OXYCODONE-ACETAMINOPHEN 5-325 MG PO TABS: 1.0000 | ORAL_TABLET | ORAL | 0 refills | Status: AC | PRN

## 2018-05-11 MED ORDER — FLEET ENEMA 7-19 GM/118ML RE ENEM: 1.0000 | ENEMA | Freq: Once | RECTAL | Status: DC | PRN

## 2018-05-11 MED ORDER — HYPROMELLOSE (GONIOSCOPIC) 2.5 % OP SOLN
1.0000 [drp] | Freq: Every day | OPHTHALMIC | Status: DC
  Administered 2018-05-11 – 2018-05-13 (×3): 1 [drp] via OPHTHALMIC
  Filled 2018-05-11: qty 15

## 2018-05-11 MED ORDER — MENTHOL 3 MG MT LOZG: 1.0000 | LOZENGE | OROMUCOSAL | Status: DC | PRN

## 2018-05-11 MED ORDER — ONDANSETRON HCL 4 MG/2ML IJ SOLN
INTRAMUSCULAR | Status: DC | PRN
  Administered 2018-05-11: 4 mg via INTRAVENOUS

## 2018-05-11 MED ORDER — CARBOXYMETHYLCELLULOSE SODIUM 0.5 % OP SOLN: 1.0000 [drp] | Freq: Every day | OPHTHALMIC | Status: DC

## 2018-05-11 MED ORDER — CELECOXIB 200 MG PO CAPS
200.0000 mg | ORAL_CAPSULE | Freq: Two times a day (BID) | ORAL | Status: DC
  Administered 2018-05-11 – 2018-05-13 (×5): 200 mg via ORAL
  Filled 2018-05-11 (×4): qty 1

## 2018-05-11 MED ORDER — ACETAMINOPHEN 325 MG PO TABS
325.0000 mg | ORAL_TABLET | Freq: Four times a day (QID) | ORAL | Status: DC | PRN
  Administered 2018-05-11 – 2018-05-12 (×4): 650 mg via ORAL
  Filled 2018-05-11 (×4): qty 2

## 2018-05-11 MED ORDER — ONDANSETRON HCL 4 MG/2ML IJ SOLN
INTRAMUSCULAR | Status: AC
  Filled 2018-05-11: qty 2

## 2018-05-11 MED ORDER — PROMETHAZINE HCL 25 MG/ML IJ SOLN: 6.2500 mg | INTRAMUSCULAR | Status: DC | PRN

## 2018-05-11 MED ORDER — MIDAZOLAM HCL 2 MG/2ML IJ SOLN
INTRAMUSCULAR | Status: AC
  Administered 2018-05-11: 1 mg via INTRAVENOUS
  Filled 2018-05-11: qty 2

## 2018-05-11 MED ORDER — CELECOXIB 200 MG PO CAPS
ORAL_CAPSULE | ORAL | Status: AC
  Filled 2018-05-11: qty 1

## 2018-05-11 SURGICAL SUPPLY — 56 items
ATTUNE PS FEM RT SZ 4 CEM KNEE (Femur) ×2 IMPLANT
ATTUNE PSRP INSR SZ4 5 KNEE (Insert) ×1 IMPLANT
ATTUNE PSRP INSR SZ4 5MM KNEE (Insert) ×1 IMPLANT
BANDAGE ELASTIC 6 VELCRO ST LF (GAUZE/BANDAGES/DRESSINGS) ×2 IMPLANT
BANDAGE ESMARK 6X9 LF (GAUZE/BANDAGES/DRESSINGS) ×1 IMPLANT
BASEPLATE TIBIAL ROTATING SZ 4 (Knees) ×2 IMPLANT
BLADE SAG 18X100X1.27 (BLADE) ×5 IMPLANT
BLADE SAGITTAL 13X1.27X60 (BLADE) ×1 IMPLANT
BLADE SAGITTAL 13X1.27X60MM (BLADE) ×1
BLADE SAW SGTL 13X75X1.27 (BLADE) IMPLANT
BNDG CMPR 9X6 STRL LF SNTH (GAUZE/BANDAGES/DRESSINGS) ×1
BNDG CMPR MED 10X6 ELC LF (GAUZE/BANDAGES/DRESSINGS) ×1
BNDG ELASTIC 6X10 VLCR STRL LF (GAUZE/BANDAGES/DRESSINGS) ×3 IMPLANT
BNDG ESMARK 6X9 LF (GAUZE/BANDAGES/DRESSINGS) ×3
BOWL SMART MIX CTS (DISPOSABLE) ×3 IMPLANT
BSPLAT TIB 4 CMNT ROT PLAT STR (Knees) ×1 IMPLANT
CEMENT HV SMART SET (Cement) ×6 IMPLANT
COVER SURGICAL LIGHT HANDLE (MISCELLANEOUS) ×3 IMPLANT
CUFF TOURNIQUET SINGLE 34IN LL (TOURNIQUET CUFF) ×3 IMPLANT
CUFF TOURNIQUET SINGLE 44IN (TOURNIQUET CUFF) IMPLANT
DRAPE EXTREMITY T 121X128X90 (DRAPE) ×3 IMPLANT
DRAPE U-SHAPE 47X51 STRL (DRAPES) ×3 IMPLANT
DRSG AQUACEL AG ADV 3.5X10 (GAUZE/BANDAGES/DRESSINGS) ×3 IMPLANT
DURAPREP 26ML APPLICATOR (WOUND CARE) ×3 IMPLANT
ELECT REM PT RETURN 9FT ADLT (ELECTROSURGICAL) ×3
ELECTRODE REM PT RTRN 9FT ADLT (ELECTROSURGICAL) ×1 IMPLANT
GLOVE BIO SURGEON STRL SZ7.5 (GLOVE) ×3 IMPLANT
GLOVE BIO SURGEON STRL SZ8.5 (GLOVE) ×3 IMPLANT
GLOVE BIOGEL PI IND STRL 8 (GLOVE) ×1 IMPLANT
GLOVE BIOGEL PI IND STRL 9 (GLOVE) ×1 IMPLANT
GLOVE BIOGEL PI INDICATOR 8 (GLOVE) ×2
GLOVE BIOGEL PI INDICATOR 9 (GLOVE) ×2
GOWN STRL REUS W/ TWL LRG LVL3 (GOWN DISPOSABLE) ×1 IMPLANT
GOWN STRL REUS W/ TWL XL LVL3 (GOWN DISPOSABLE) ×2 IMPLANT
GOWN STRL REUS W/TWL LRG LVL3 (GOWN DISPOSABLE) ×3
GOWN STRL REUS W/TWL XL LVL3 (GOWN DISPOSABLE) ×6
HANDPIECE INTERPULSE COAX TIP (DISPOSABLE) ×3
HOOD PEEL AWAY FACE SHEILD DIS (HOOD) ×6 IMPLANT
KIT BASIN OR (CUSTOM PROCEDURE TRAY) ×3 IMPLANT
KIT TURNOVER KIT B (KITS) ×3 IMPLANT
MANIFOLD NEPTUNE II (INSTRUMENTS) ×3 IMPLANT
NEEDLE 22X1 1/2 (OR ONLY) (NEEDLE) ×6 IMPLANT
NS IRRIG 1000ML POUR BTL (IV SOLUTION) ×3 IMPLANT
PACK TOTAL JOINT (CUSTOM PROCEDURE TRAY) ×3 IMPLANT
PAD ARMBOARD 7.5X6 YLW CONV (MISCELLANEOUS) ×6 IMPLANT
PATELLA MEDIAL ATTUN 35MM KNEE (Knees) ×2 IMPLANT
SET HNDPC FAN SPRY TIP SCT (DISPOSABLE) ×1 IMPLANT
SUT VIC AB 1 CTX 36 (SUTURE) ×3
SUT VIC AB 1 CTX36XBRD ANBCTR (SUTURE) ×1 IMPLANT
SUT VIC AB 2-0 CT1 27 (SUTURE) ×3
SUT VIC AB 2-0 CT1 TAPERPNT 27 (SUTURE) ×1 IMPLANT
SUT VIC AB 3-0 CT1 27 (SUTURE) ×3
SUT VIC AB 3-0 CT1 TAPERPNT 27 (SUTURE) ×1 IMPLANT
SYR CONTROL 10ML LL (SYRINGE) ×6 IMPLANT
TOWEL OR 17X24 6PK STRL BLUE (TOWEL DISPOSABLE) ×3 IMPLANT
TOWEL OR 17X26 10 PK STRL BLUE (TOWEL DISPOSABLE) ×3 IMPLANT

## 2018-05-11 NOTE — Anesthesia Procedure Notes (Signed)
Procedure Name: MAC Date/Time: 05/11/2018 12:45 PM Performed by: Harden Mo, CRNA Pre-anesthesia Checklist: Patient identified, Emergency Drugs available, Suction available and Patient being monitored Patient Re-evaluated:Patient Re-evaluated prior to induction Oxygen Delivery Method: Simple face mask Preoxygenation: Pre-oxygenation with 100% oxygen Induction Type: IV induction Placement Confirmation: positive ETCO2 and breath sounds checked- equal and bilateral Dental Injury: Teeth and Oropharynx as per pre-operative assessment

## 2018-05-11 NOTE — Anesthesia Preprocedure Evaluation (Addendum)
Anesthesia Evaluation  Patient identified by MRN, date of birth, ID band Patient awake    Reviewed: Allergy & Precautions, NPO status , Patient's Chart, lab work & pertinent test results  History of Anesthesia Complications Negative for: history of anesthetic complications  Airway Mallampati: I  TM Distance: >3 FB Neck ROM: Full    Dental  (+) Teeth Intact, Dental Advisory Given   Pulmonary sleep apnea and Continuous Positive Airway Pressure Ventilation ,    breath sounds clear to auscultation       Cardiovascular hypertension, Pt. on medications  Rhythm:Regular Rate:Normal     Neuro/Psych negative neurological ROS  negative psych ROS   GI/Hepatic Neg liver ROS, GERD  Medicated,  Endo/Other  negative endocrine ROS  Renal/GU negative Renal ROS     Musculoskeletal negative musculoskeletal ROS (+) Arthritis , Osteoarthritis,    Abdominal Normal abdominal exam  (+)   Peds  Hematology negative hematology ROS (+)   Anesthesia Other Findings Day of surgery medications reviewed with the patient.  Reproductive/Obstetrics                            Lab Results  Component Value Date   WBC 7.1 05/02/2018   HGB 15.1 (H) 05/02/2018   HCT 46.6 (H) 05/02/2018   MCV 100.6 (H) 05/02/2018   PLT 331 05/02/2018   Lab Results  Component Value Date   CREATININE 0.56 05/02/2018   BUN 9 05/02/2018   NA 142 05/02/2018   K 4.0 05/02/2018   CL 108 05/02/2018   CO2 29 05/02/2018     Anesthesia Physical Anesthesia Plan  ASA: II  Anesthesia Plan: Spinal   Post-op Pain Management:  Regional for Post-op pain   Induction: Intravenous  PONV Risk Score and Plan: 2 and Ondansetron, Dexamethasone, Treatment may vary due to age or medical condition and Midazolam  Airway Management Planned: Simple Face Mask  Additional Equipment: None  Intra-op Plan:   Post-operative Plan: Extubation in  OR  Informed Consent: I have reviewed the patients History and Physical, chart, labs and discussed the procedure including the risks, benefits and alternatives for the proposed anesthesia with the patient or authorized representative who has indicated his/her understanding and acceptance.     Plan Discussed with: CRNA  Anesthesia Plan Comments:        Anesthesia Quick Evaluation

## 2018-05-11 NOTE — Anesthesia Procedure Notes (Signed)
Spinal  Patient location during procedure: OR Start time: 05/11/2018 12:55 PM End time: 05/11/2018 1:01 PM Staffing Anesthesiologist: Effie Berkshire, MD Performed: anesthesiologist  Preanesthetic Checklist Completed: patient identified, site marked, surgical consent, pre-op evaluation, timeout performed, IV checked, risks and benefits discussed and monitors and equipment checked Spinal Block Patient position: sitting Prep: Betadine Patient monitoring: heart rate, continuous pulse ox, blood pressure and cardiac monitor Approach: midline Location: L4-5 Injection technique: single-shot Needle Needle type: Introducer and Pencan  Needle gauge: 24 G Needle length: 9 cm Additional Notes Negative paresthesia. Negative blood return. Positive free-flowing CSF. Expiration date of kit checked and confirmed. Patient tolerated procedure well, without complications.

## 2018-05-11 NOTE — Anesthesia Postprocedure Evaluation (Signed)
Anesthesia Post Note  Patient: Rachel Ballard  Procedure(s) Performed: RIGHT TOTAL KNEE ARTHROPLASTY (Right Knee)     Patient location during evaluation: PACU Anesthesia Type: Spinal Level of consciousness: oriented and awake and alert Pain management: pain level controlled Vital Signs Assessment: post-procedure vital signs reviewed and stable Respiratory status: spontaneous breathing, respiratory function stable and patient connected to nasal cannula oxygen Cardiovascular status: blood pressure returned to baseline and stable Postop Assessment: no headache, no backache and no apparent nausea or vomiting Anesthetic complications: no    Last Vitals:  Vitals:   05/11/18 1638 05/11/18 1715  BP: 140/80 (!) 148/64  Pulse: (!) 103 97  Resp: 20 20  Temp: 36.4 C 36.5 C  SpO2: 97% 96%    Last Pain:  Vitals:   05/11/18 1715  TempSrc: Oral  PainSc:                  Barnet Glasgow

## 2018-05-11 NOTE — Transfer of Care (Signed)
Immediate Anesthesia Transfer of Care Note  Patient: Rachel Ballard  Procedure(s) Performed: RIGHT TOTAL KNEE ARTHROPLASTY (Right Knee)  Patient Location: PACU  Anesthesia Type:Spinal  Level of Consciousness: awake, alert  and oriented  Airway & Oxygen Therapy: Patient Spontanous Breathing and Patient connected to nasal cannula oxygen  Post-op Assessment: Report given to RN and Post -op Vital signs reviewed and stable  Post vital signs: Reviewed and stable  Last Vitals:  Vitals Value Taken Time  BP    Temp    Pulse 87 05/11/2018  2:51 PM  Resp 18 05/11/2018  2:51 PM  SpO2 98 % 05/11/2018  2:51 PM  Vitals shown include unvalidated device data.  Last Pain:  Vitals:   05/11/18 1205  TempSrc:   PainSc: 0-No pain      Patients Stated Pain Goal: 3 (16/10/96 0454)  Complications: No apparent anesthesia complications

## 2018-05-11 NOTE — Interval H&P Note (Signed)
History and Physical Interval Note:  05/11/2018 12:41 PM  Rachel Ballard  has presented today for surgery, with the diagnosis of RIGHT KNEE OSTEOARTHRITIS  The various methods of treatment have been discussed with the patient and family. After consideration of risks, benefits and other options for treatment, the patient has consented to  Procedure(s): RIGHT TOTAL KNEE ARTHROPLASTY (Right) as a surgical intervention .  The patient's history has been reviewed, patient examined, no change in status, stable for surgery.  I have reviewed the patient's chart and labs.  Questions were answered to the patient's satisfaction.     Kerin Salen

## 2018-05-11 NOTE — Anesthesia Procedure Notes (Signed)
Anesthesia Regional Block: Adductor canal block   Pre-Anesthetic Checklist: ,, timeout performed, Correct Patient, Correct Site, Correct Laterality, Correct Procedure, Correct Position, site marked, Risks and benefits discussed,  Surgical consent,  Pre-op evaluation,  At surgeon's request and post-op pain management  Laterality: Right  Prep: chloraprep       Needles:  Injection technique: Single-shot  Needle Type: Echogenic Needle     Needle Length: 9cm  Needle Gauge: 21     Additional Needles:   Procedures:,,,, ultrasound used (permanent image in chart),,,,  Narrative:  Start time: 05/11/2018 12:02 PM End time: 05/11/2018 12:07 PM Injection made incrementally with aspirations every 5 mL.  Performed by: Personally  Anesthesiologist: Effie Berkshire, MD  Additional Notes: Patient tolerated the procedure well. Local anesthetic introduced in an incremental fashion under minimal resistance after negative aspirations. No paresthesias were elicited. After completion of the procedure, no acute issues were identified and patient continued to be monitored by RN.

## 2018-05-11 NOTE — Op Note (Signed)
PATIENT ID:      Rachel Ballard  MRN:     [id number] DOB/AGE:    1941-06-19 / 77 y.o.       OPERATIVE REPORT    DATE OF PROCEDURE:  05/11/2018       PREOPERATIVE DIAGNOSIS:   RIGHT KNEE OSTEOARTHRITIS      Estimated body mass index is 29.93 kg/m as calculated from the following:   Height as of 05/02/18: 5\' 1"  (1.549 m).   Weight as of 05/02/18: 158 lb 6.4 oz (71.8 kg).                                                        POSTOPERATIVE DIAGNOSIS:   RIGHT KNEE OSTEOARTHRITIS                                                                      PROCEDURE:  Procedure(s): RIGHT TOTAL KNEE ARTHROPLASTY Using DepuyAttune RP implants #4R Femur, #4Tibia, 5 mm Attune RP bearing, 35 Patella     SURGEON: Kerin Salen    ASSISTANT:   Kerry Hough. [employer]   (Present and scrubbed throughout the case, critical for assistance with exposure, retraction, instrumentation, and closure.)         ANESTHESIA: Spinal, 20cc Exparel, 50cc 0.25% Marcaine  EBL: 300cc  FLUID REPLACEMENT: 1600cc crystalloid  TOURNIQUET TIME: 49min  Drains: None  Tranexamic Acid: 1gm IV, 2gm topical  COMPLICATIONS:  None         INDICATIONS FOR PROCEDURE: The patient has  RIGHT KNEE OSTEOARTHRITIS, Val deformities, XR shows bone on bone arthritis, lateral subluxation of tibia. Patient has failed all conservative measures including anti-inflammatory medicines, narcotics, attempts at  exercise and weight loss, cortisone injections and viscosupplementation.  Risks and benefits of surgery have been discussed, questions answered.   DESCRIPTION OF PROCEDURE: The patient identified by armband, received  IV antibiotics, in the holding area at [facility]. Patient taken to the operating room, appropriate anesthetic  monitors were attached, and Spinal anesthesia was  induced. Tourniquet  applied high to the operative thigh. Lateral post and foot positioner  applied to the table, the lower extremity was then prepped and draped  in  usual sterile fashion from the toes to the tourniquet. Time-out procedure was performed. We began the operation, with the knee flexed 120 degrees, by making the anterior midline incision starting at handbreadth above the patella going over the patella 1 cm medial to and 4 cm distal to the tibial tubercle. Small bleeders in the skin and the  subcutaneous tissue identified and cauterized. Transverse retinaculum was incised and reflected medially and a medial parapatellar arthrotomy was accomplished. the patella was everted and theprepatellar fat pad resected. The superficial medial collateral  ligament was then elevated from anterior to posterior along the proximal  flare of the tibia and anterior half of the menisci resected. The knee was hyperflexed exposing bone on bone arthritis. Peripheral and notch osteophytes as well as the cruciate ligaments were then resected. We continued to  work our way around posteriorly along the  proximal tibia, and externally  rotated the tibia subluxing it out from underneath the femur. A McHale  retractor was placed through the notch and a lateral Hohmann retractor  placed, and we then drilled through the proximal tibia in line with the  axis of the tibia followed by an intramedullary guide rod and 2-degree  posterior slope cutting guide. The tibial cutting guide, 3 degree posterior sloped, was pinned into place allowing resection of 7 mm of bone medially and 2 mm of bone laterally. Satisfied with the tibial resection, we then  entered the distal femur 2 mm anterior to the PCL origin with the  intramedullary guide rod and applied the distal femoral cutting guide  set at 9 mm, with 5 degrees of valgus. This was pinned along the  epicondylar axis. At this point, the distal femoral cut was accomplished without difficulty. We then sized for a #4R femoral component and pinned the guide in 3 degrees of external rotation. The chamfer cutting guide was pinned into place. The  anterior, posterior, and chamfer cuts were accomplished without difficulty followed by  the Attune RP box cutting guide and the box cut. We also removed posterior osteophytes from the posterior femoral condyles. At this  time, the knee was brought into full extension. We checked our  extension and flexion gaps and found them symmetric for a 5 mm bearing. Distracting in extension with a lamina spreader, the posterior horns of the menisci were removed, and Exparel, diluted to 60 cc, with 20cc NS, and 20cc 0.5% Marcaine,was injected into the capsule and synovium of the knee. The posterior patella cut was accomplished with the 9.5 mm Attune cutting guide, sized for a 59mm dome, and the fixation pegs drilled.The knee  was then once again hyperflexed exposing the proximal tibia. We sized for a # 4 tibial base plate, applied the smokestack and the conical reamer followed by the the Delta fin keel punch. We then hammered into place the Attune RP trial femoral component, drilled the lugs, inserted a  35 mm trial bearing, trial patellar button, and took the knee through range of motion from 0-130 degrees. No thumb pressure was required for patellar Tracking. At this point, the limb was wrapped with an Esmarch bandage and the tourniquet inflated to 350 mmHg. All trial components were removed, mating surfaces irrigated with pulse lavage, and dried with suction and sponges. 10 cc of the Exparel solution was applied to the cancellus bone of the patella distal femur and proximal tibia.  After waiting 1 minute, the bony surfaces were again, dried with sponges. A double batch of DePuy HV cement with 1500 mg of Zinacef was mixed and applied to all bony metallic mating surfaces except for the posterior condyles of the femur itself. In order, we hammered into place the tibial tray and removed excess cement, the femoral component and removed excess cement. The final Attune RP bearing  was inserted, and the knee brought to full  extension with compression.  The patellar button was clamped into place, and excess cement  removed. While the cement cured the wound was irrigated out with normal saline solution pulse lavage. Ligament stability and patellar tracking were checked and found to be excellent. The parapatellar arthrotomy was closed with  running #1 Vicryl suture. The subcutaneous tissue with 0 and 2-0 undyed  Vicryl suture, and the skin with running 3-0 SQ vicryl. A dressing of Xeroform,  4 x 4, dressing sponges, Webril, and Ace wrap applied. The patient  awakened,  and taken to recovery room without difficulty.   Kerin Salen 05/11/2018, 2:31 PM

## 2018-05-12 ENCOUNTER — Encounter (HOSPITAL_COMMUNITY): Payer: Self-pay | Admitting: Orthopedic Surgery

## 2018-05-12 LAB — BASIC METABOLIC PANEL
Anion gap: 8 (ref 5–15)
BUN: 9 mg/dL (ref 8–23)
CHLORIDE: 107 mmol/L (ref 98–111)
CO2: 25 mmol/L (ref 22–32)
CREATININE: 0.72 mg/dL (ref 0.44–1.00)
Calcium: 8.3 mg/dL — ABNORMAL LOW (ref 8.9–10.3)
GFR calc Af Amer: >60 mL/min (ref >60)
GFR calc non Af Amer: >60 mL/min (ref >60)
GLUCOSE: 156 mg/dL — AB (ref 70–99)
POTASSIUM: 3.9 mmol/L (ref 3.5–5.1)
Sodium: 140 mmol/L (ref 135–145)

## 2018-05-12 LAB — CBC
HEMATOCRIT: 37.4 % (ref 36.0–46.0)
HEMOGLOBIN: 12.4 g/dL (ref 12.0–15.0)
MCH: 32.9 pg (ref 26.0–34.0)
MCHC: 33.2 g/dL (ref 30.0–36.0)
MCV: 99.2 fL (ref 78.0–100.0)
Platelets: 336 10*3/uL (ref 150–400)
RBC: 3.77 MIL/uL — AB (ref 3.87–5.11)
RDW: 12.7 % (ref 11.5–15.5)
WBC: 16.8 10*3/uL — ABNORMAL HIGH (ref 4.0–10.5)

## 2018-05-12 NOTE — Progress Notes (Signed)
PATIENT ID: Rachel Ballard  MRN: [id number]  DOB/AGE:  June 27, 1941 / 77 y.o.  1 Day Post-Op Procedure(s) (LRB): RIGHT TOTAL KNEE ARTHROPLASTY (Right)    PROGRESS NOTE Subjective: Patient is alert, oriented, no Nausea, no Vomiting, yes passing gas. Taking PO well. Denies SOB, Chest or Calf Pain. Using Incentive Spirometer, PAS in place. Ambulate up to BR, Patient reports pain as 2 out of 10, can easily do straight leg raise in the  Objective: Vital signs in last 24 hours: Vitals:   05/11/18 2146 05/11/18 2310 05/12/18 0029 05/12/18 0430  BP: 130/61  (Abnormal) 109/49 (Abnormal) 121/52  Pulse: 86  89 77  Resp: 16  16 16   Temp: 98.6 F (37 C)  97.9 F (36.6 C) 98.6 F (37 C)  TempSrc: Oral  Oral Oral  SpO2: 95% 96% 95% 98%  Weight:      Height:          Intake/Output from previous day: I/O last 3 completed shifts: In: 2460.7 [P.O.:300; I.V.:2060.7; IV Piggyback:100] Out: 2525 [Urine:2425; Blood:100]   Intake/Output this shift: No intake/output data recorded.   LABORATORY DATA: Recent Labs    05/12/18 0348  WBC 16.8*  HGB 12.4  HCT 37.4  PLT 336  NA 140  K 3.9  CL 107  CO2 25  BUN 9  CREATININE 0.72  GLUCOSE 156*  CALCIUM 8.3*    Examination: Neurologically intact ABD soft Neurovascular intact Sensation intact distally Intact pulses distally Dorsiflexion/Plantar flexion intact Incision: dressing C/D/I No cellulitis present Compartment soft}  Assessment:   1 Day Post-Op Procedure(s) (LRB): RIGHT TOTAL KNEE ARTHROPLASTY (Right) ADDITIONAL DIAGNOSIS: Expected Acute Blood Loss Anemia Anticipated LOS equal to or greater than 2 midnights due to - Age 46 and older with one or more of the following:  - Obesity  - Expected need for hospital services (PT, OT, Nursing) required for safe  discharge      Plan: PT/OT WBAT, AROM and PROM  DVT Prophylaxis:  SCDx72hrs, ASA 81 mg BID x 2 weeks DISCHARGE PLAN: Home,Possibly today if patient passes all physical  therapy goals DISCHARGE NEEDS: HHPT, Walker and 3-in-1 comode seat     Kerin Salen 05/12/2018, 7:46 AM

## 2018-05-12 NOTE — Progress Notes (Signed)
Physical Therapy Treatment Patient Details Name: Rachel Ballard MRN: [id number] DOB: 1941-02-20 Today's Date: 05/12/2018    History of Present Illness Pt is a 77 y.o. F with significant PMH of foot surgery who presents s/p right total knee arthroplasty.    PT Comments    Patient making significant progression towards her physical therapy goals. Slightly more sore this session in medial/posterior knee but able to participate fully. Ambulating 400 feet using walker modified independently with good gait speed, posture, and knee flexion. Performed stair training in preparation for discharge home, climbing 5 steps with rails and 1 step with walker to simulate home environment. Reviewed HEP and progression.     Follow Up Recommendations  Outpatient PT;Follow surgeon's recommendation for DC plan and follow-up therapies     Equipment Recommendations  Rolling walker with 5" wheels;3in1 (PT)    Recommendations for Other Services       Precautions / Restrictions Precautions Precautions: Fall;Knee Precaution Booklet Issued: Yes (comment) Precaution Comments: reviewed bone foam use Restrictions Weight Bearing Restrictions: Yes RLE Weight Bearing: Weight bearing as tolerated    Mobility  Bed Mobility Overal bed mobility: Independent                Transfers Overall transfer level: Modified independent Equipment used: Rolling walker (2 wheeled)                Ambulation/Gait Ambulation/Gait assistance: Modified independent (Device/Increase time) Gait Distance (Feet): 400 Feet Assistive device: Rolling walker (2 wheeled) Gait Pattern/deviations: Step-through pattern     General Gait Details: Cues for pointing right foot forward during gait to prevent internal rotation. Patient continues with good posture and gait speed.   Stairs Stairs: Yes Stairs assistance: Supervision Stair Management: With walker;Two rails Number of Stairs: 6 General stair comments: Patient  initially climbed 5 steps with bilateral rails and then climbed 1 step with no rails using walker to simulate home environment. Able to self cue for sequencing.    Wheelchair Mobility    Modified Rankin (Stroke Patients Only)       Balance Overall balance assessment: No apparent balance deficits (not formally assessed)                                          Cognition Arousal/Alertness: Awake/alert Behavior During Therapy: WFL for tasks assessed/performed Overall Cognitive Status: Within Functional Limits for tasks assessed                                 General Comments: Very pleasant and eager to participate      Exercises Other Exercises Other Exercises: Reviewed remaining HEP including supine heel slides and hip abduction as well as progression of exercisese.    General Comments        Pertinent Vitals/Pain Pain Assessment: Faces Faces Pain Scale: Hurts little more Pain Location: posterior and medial right knee Pain Descriptors / Indicators: Guarding;Aching Pain Intervention(s): Monitored during session    Home Living                      Prior Function            PT Goals (current goals can now be found in the care plan section) Acute Rehab PT Goals Patient Stated Goal: return to doing things independently PT Goal Formulation:  With patient Time For Goal Achievement: 05/17/18 Potential to Achieve Goals: Good Progress towards PT goals: Progressing toward goals    Frequency    7X/week      PT Plan Current plan remains appropriate    Co-evaluation              AM-PAC PT "6 Clicks" Daily Activity  Outcome Measure  Difficulty turning over in bed (including adjusting bedclothes, sheets and blankets)?: None Difficulty moving from lying on back to sitting on the side of the bed? : None Difficulty sitting down on and standing up from a chair with arms (e.g., wheelchair, bedside commode, etc,.)?:  None Help needed moving to and from a bed to chair (including a wheelchair)?: None Help needed walking in hospital room?: None Help needed climbing 3-5 steps with a railing? : A Little 6 Click Score: 23    End of Session Equipment Utilized During Treatment: Gait belt Activity Tolerance: Patient tolerated treatment well Patient left: in chair;with call bell/phone within reach   PT Visit Diagnosis: Pain;Difficulty in walking, not elsewhere classified (R26.2) Pain - Right/Left: Right Pain - part of body: Knee     Time: 2671-2458 PT Time Calculation (min) (ACUTE ONLY): 23 min  Charges:  $Gait Training: 8-22 mins $Therapeutic Activity: 8-22 mins                     Rachel Ballard, PT, DPT Acute Rehabilitation Services  Pager: [place] 05/12/2018, 5:01 PM

## 2018-05-12 NOTE — Social Work (Signed)
CSW acknowledging consult for SNF placement, current recommendations for outpatient therapies.   CSW signing off. Please consult if any additional needs arise.  Alexander Mt, [place] Work [phone]

## 2018-05-12 NOTE — Progress Notes (Signed)
Pt stated she could place self on CPAP when ready for bed. RT added sterile water. Will continue to monitor as needed.

## 2018-05-12 NOTE — Plan of Care (Signed)
  Problem: Activity: Goal: Risk for activity intolerance will decrease Outcome: Progressing   Problem: Elimination: Goal: Will not experience complications related to bowel motility Outcome: Progressing Goal: Will not experience complications related to urinary retention Outcome: Progressing   Problem: Pain Managment: Goal: General experience of comfort will improve Outcome: Progressing   Problem: Safety: Goal: Ability to remain free from injury will improve Outcome: Progressing   Problem: Skin Integrity: Goal: Risk for impaired skin integrity will decrease Outcome: Progressing   

## 2018-05-12 NOTE — Care Management (Signed)
Patient is Ortho Bundle, HH has been arranged, patient doesn't have DME, CM contacted [facility] to provide DME., left voice message for Renee.

## 2018-05-12 NOTE — Progress Notes (Signed)
Orthopedic Tech Progress Note Patient Details:  Rachel Ballard 26-Sep-1941 [id number]  Ortho Devices Type of Ortho Device: Bone foam zero knee Ortho Device/Splint Location: rle Ortho Device/Splint Interventions: Application   Post Interventions Patient Tolerated: Well Instructions Provided: Care of device   Hildred Priest 05/12/2018, 8:12 AM

## 2018-05-12 NOTE — Evaluation (Signed)
Physical Therapy Evaluation Patient Details Name: Rachel Ballard MRN: [id number] DOB: 03/09/1941 Today's Date: 05/12/2018   History of Present Illness  Pt is a 77 y.o. F with significant PMH of foot surgery who presents s/p right total knee arthroplasty.  Clinical Impression  Pt is s/p TKA resulting in the deficits listed below (see PT Problem List). On PT evaluation, patient demonstrates excellent pain control, activity tolerance, and motivation to participate. Ambulating 300 feet with walker, modified independently demonstrating adequate step through pattern, gait speed, and knee flexion during swing phase. Reviewed HEP and pt performed with good carryover. Goniometric ROM seated: 0-103 degrees flexion. Patient will be an excellent candidate for OPPT in order to maximize RLE range of motion, strengthening, and progress gait training. Will perform stair training prior to discharge. Pt will benefit from skilled PT to increase their independence and safety with mobility to allow discharge to the venue listed below.      Follow Up Recommendations Outpatient PT;Follow surgeon's recommendation for DC plan and follow-up therapies    Equipment Recommendations  Rolling walker with 5" wheels;3in1 (PT)    Recommendations for Other Services       Precautions / Restrictions Precautions Precautions: Fall;Knee Precaution Booklet Issued: Yes (comment) Precaution Comments: reviewed no pillow under knee, bone foam usage Restrictions Weight Bearing Restrictions: Yes RLE Weight Bearing: Weight bearing as tolerated      Mobility  Bed Mobility Overal bed mobility: Independent                Transfers Overall transfer level: Modified independent Equipment used: Rolling walker (2 wheeled)                Ambulation/Gait Ambulation/Gait assistance: Modified independent (Device/Increase time) Gait Distance (Feet): 300 Feet Assistive device: Rolling walker (2 wheeled) Gait  Pattern/deviations: Step-through pattern     General Gait Details: Patient with good posture and adequate knee flexion during swing phase. Right foot pronation during stance phase which is baseline due to a foot surgery  Stairs            Wheelchair Mobility    Modified Rankin (Stroke Patients Only)       Balance Overall balance assessment: No apparent balance deficits (not formally assessed)                                           Pertinent Vitals/Pain Pain Assessment: 0-10 Pain Score: 2  Pain Location: posterior right knee Pain Descriptors / Indicators: Guarding Pain Intervention(s): Monitored during session    Home Living Family/patient expects to be discharged to:: Private residence Living Arrangements: Spouse/significant other Available Help at Discharge: Family Type of Home: House Home Access: Stairs to enter Entrance Stairs-Rails: None((adequate depth to fit walker)) Entrance Stairs-Number of Steps: 2 Home Layout: One level Home Equipment: Cane - single point;Shower seat      Prior Function Level of Independence: Independent         Comments: Enjoys doing yard work and Therapist, art        Extremity/Trunk Assessment   Upper Extremity Assessment Upper Extremity Assessment: Overall WFL for tasks assessed    Lower Extremity Assessment Lower Extremity Assessment: Overall WFL for tasks assessed;RLE deficits/detail RLE Deficits / Details: s/p right TKA. Able to perform straight leg raise.    Cervical / Trunk Assessment Cervical / Trunk Assessment: Normal  Communication   Communication: No difficulties  Cognition Arousal/Alertness: Awake/alert Behavior During Therapy: WFL for tasks assessed/performed Overall Cognitive Status: Within Functional Limits for tasks assessed                                 General Comments: Very pleasant and eager to participate      General Comments       Exercises Total Joint Exercises Ankle Circles/Pumps: 5 reps;Both;Seated Quad Sets: 15 reps;Right;Seated Heel Slides: 5 reps;Right;Seated Long Arc Quad: 15 reps;Right;Seated Goniometric ROM: Seated: 0-103 degrees flexion   Assessment/Plan    PT Assessment Patient needs continued PT services  PT Problem List Decreased strength;Decreased range of motion;Decreased activity tolerance;Decreased balance;Decreased mobility;Pain       PT Treatment Interventions DME instruction;Gait training;Stair training;Functional mobility training;Therapeutic exercise;Therapeutic activities;Balance training;Patient/family education    PT Goals (Current goals can be found in the Care Plan section)  Acute Rehab PT Goals Patient Stated Goal: return to doing things independently PT Goal Formulation: With patient Time For Goal Achievement: 05/17/18 Potential to Achieve Goals: Good    Frequency 7X/week   Barriers to discharge        Co-evaluation               AM-PAC PT "6 Clicks" Daily Activity  Outcome Measure Difficulty turning over in bed (including adjusting bedclothes, sheets and blankets)?: None Difficulty moving from lying on back to sitting on the side of the bed? : None Difficulty sitting down on and standing up from a chair with arms (e.g., wheelchair, bedside commode, etc,.)?: None Help needed moving to and from a bed to chair (including a wheelchair)?: None Help needed walking in hospital room?: None Help needed climbing 3-5 steps with a railing? : A Little 6 Click Score: 23    End of Session   Activity Tolerance: Patient tolerated treatment well Patient left: in chair;with call bell/phone within reach   PT Visit Diagnosis: Pain;Difficulty in walking, not elsewhere classified (R26.2) Pain - Right/Left: Right Pain - part of body: Knee    Time: 8101-7510 PT Time Calculation (min) (ACUTE ONLY): 24 min   Charges:   PT Evaluation $PT Eval Low Complexity: 1 Low PT  Treatments $Therapeutic Exercise: 8-22 mins        Ellamae Sia, PT, DPT Acute Rehabilitation Services  Pager: [place] 05/12/2018, 8:40 AM

## 2018-05-13 LAB — CBC
HCT: 37 % (ref 36.0–46.0)
Hemoglobin: 12.1 g/dL (ref 12.0–15.0)
MCH: 32.8 pg (ref 26.0–34.0)
MCHC: 32.7 g/dL (ref 30.0–36.0)
MCV: 100.3 fL — AB (ref 78.0–100.0)
PLATELETS: 318 10*3/uL (ref 150–400)
RBC: 3.69 MIL/uL — ABNORMAL LOW (ref 3.87–5.11)
RDW: 13.3 % (ref 11.5–15.5)
WBC: 10.5 10*3/uL (ref 4.0–10.5)

## 2018-05-13 NOTE — Progress Notes (Signed)
PATIENT ID: Rachel Ballard  MRN: [id number]  DOB/AGE:  1941/04/14 / 77 y.o.  2 Days Post-Op Procedure(s) (LRB): RIGHT TOTAL KNEE ARTHROPLASTY (Right)    PROGRESS NOTE Subjective: Patient is alert, oriented, no Nausea, no Vomiting, yes passing gas. Taking PO well. Denies SOB, Chest or Calf Pain. Using Incentive Spirometer, PAS in place. Ambulate WBAT with pt walking 400 ft and attempting stairs, Patient reports pain as moderate .    Objective: Vital signs in last 24 hours: Vitals:   05/12/18 0430 05/12/18 1753 05/12/18 2047 05/13/18 0434  BP: (!) 121/52 (!) 133/56 134/71 (!) 132/50  Pulse: 77 80 90 79  Resp: 16 16 16 14   Temp: 98.6 F (37 C) 98 F (36.7 C) 98.3 F (36.8 C) 98.3 F (36.8 C)  TempSrc: Oral Oral Oral Oral  SpO2: 98% 98% 98% 97%  Weight:      Height:          Intake/Output from previous day: I/O last 3 completed shifts: In: 1560 [P.O.:1560] Out: -    Intake/Output this shift: No intake/output data recorded.   LABORATORY DATA: Recent Labs    05/12/18 0348 05/13/18 0414  WBC 16.8* 10.5  HGB 12.4 12.1  HCT 37.4 37.0  PLT 336 318  NA 140  --   K 3.9  --   CL 107  --   CO2 25  --   BUN 9  --   CREATININE 0.72  --   GLUCOSE 156*  --   CALCIUM 8.3*  --     Examination: Neurologically intact Neurovascular intact Sensation intact distally Intact pulses distally Dorsiflexion/Plantar flexion intact Incision: dressing C/D/I No cellulitis present Compartment soft}  Assessment:   2 Days Post-Op Procedure(s) (LRB): RIGHT TOTAL KNEE ARTHROPLASTY (Right) ADDITIONAL DIAGNOSIS: Expected Acute Blood Loss Anemia, Sleep Apnea Anticipated LOS equal to or greater than 2 midnights due to - Age 78 and older with one or more of the following:  - Obesity  - Expected need for hospital services (PT, OT, Nursing) required for safe  discharge  - Anticipated need for postoperative skilled nursing care or inpatient rehab    Plan: PT/OT WBAT, AROM and PROM  DVT  Prophylaxis:  SCDx72hrs, ASA 81 mg BID x 2 weeks DISCHARGE PLAN: Home DISCHARGE NEEDS: HHPT, Walker and 3-in-1 comode seat     Joanell Rising 05/13/2018, 7:20 AM

## 2018-05-13 NOTE — Progress Notes (Signed)
Physical Therapy Treatment Patient Details Name: Rachel Ballard MRN: [id number] DOB: [birth year] Today's Date: 05/13/2018    History of Present Illness Pt is a 77 y.o. F with significant PMH of foot surgery who presents s/p right total knee arthroplasty.    PT Comments    Patient continuing to progress very well towards physical therapy goals. Reports slightly increased pain/discomfort located on anterior and posterior right knee. Increase in ambulation distance to 510 feet using walker, modified independent. Goniometric ROM seated: 106 degrees knee flexion. Reviewed cryotherapy dosage and demonstrated car transfer. Patient with no further questions/concerns.    Follow Up Recommendations  Outpatient PT;Follow surgeon's recommendation for DC plan and follow-up therapies     Equipment Recommendations  Rolling walker with 5" wheels;3in1 (PT)    Recommendations for Other Services       Precautions / Restrictions Precautions Precautions: Fall;Knee Precaution Booklet Issued: Yes (comment) Restrictions Weight Bearing Restrictions: Yes RLE Weight Bearing: Weight bearing as tolerated    Mobility  Bed Mobility Overal bed mobility: Independent                Transfers Overall transfer level: Modified independent Equipment used: Rolling walker (2 wheeled)             General transfer comment: Instructions for safe hand placement during transition from stand to sit  Ambulation/Gait Ambulation/Gait assistance: Modified independent (Device/Increase time) Gait Distance (Feet): 510 Feet Assistive device: Rolling walker (2 wheeled) Gait Pattern/deviations: Step-through pattern     General Gait Details: Cues for posture and walker proximity. Patient continues with good knee flexion during swing phase and adequate gait speed.   Stairs             Wheelchair Mobility    Modified Rankin (Stroke Patients Only)       Balance Overall balance assessment: No apparent  balance deficits (not formally assessed)                                          Cognition Arousal/Alertness: Awake/alert Behavior During Therapy: WFL for tasks assessed/performed Overall Cognitive Status: Within Functional Limits for tasks assessed                                 General Comments: Very pleasant and eager to participate      Exercises Total Joint Exercises Heel Slides: 5 reps;Right;Seated Goniometric ROM: Seated: 106 degrees knee flexion    General Comments        Pertinent Vitals/Pain Pain Assessment: Faces Faces Pain Scale: Hurts a little bit Pain Location: anterior, posterior and medial right knee Pain Descriptors / Indicators: Guarding;Aching Pain Intervention(s): Monitored during session;Premedicated before session    Home Living                      Prior Function            PT Goals (current goals can now be found in the care plan section) Acute Rehab PT Goals Patient Stated Goal: return to doing things independently PT Goal Formulation: With patient Time For Goal Achievement: 05/17/18 Potential to Achieve Goals: Good Progress towards PT goals: Progressing toward goals    Frequency    7X/week      PT Plan Current plan remains appropriate    Co-evaluation  AM-PAC PT "6 Clicks" Daily Activity  Outcome Measure  Difficulty turning over in bed (including adjusting bedclothes, sheets and blankets)?: None Difficulty moving from lying on back to sitting on the side of the bed? : None Difficulty sitting down on and standing up from a chair with arms (e.g., wheelchair, bedside commode, etc,.)?: None Help needed moving to and from a bed to chair (including a wheelchair)?: None Help needed walking in hospital room?: None Help needed climbing 3-5 steps with a railing? : A Little 6 Click Score: 23    End of Session   Activity Tolerance: Patient tolerated treatment well Patient  left: with call bell/phone within reach;in bed;with family/visitor present Nurse Communication: Mobility status PT Visit Diagnosis: Pain;Difficulty in walking, not elsewhere classified (R26.2) Pain - Right/Left: Right Pain - part of body: Knee     Time: 0802-0823 PT Time Calculation (min) (ACUTE ONLY): 21 min  Charges:  $Gait Training: 8-22 mins                     Ellamae Sia, PT, DPT Acute Rehabilitation Services  Pager: [place] 05/13/2018, 8:28 AM

## 2018-05-13 NOTE — Plan of Care (Signed)
  Problem: Clinical Measurements: Goal: Ability to maintain clinical measurements within normal limits will improve Outcome: Progressing   Problem: Elimination: Goal: Will not experience complications related to bowel motility Outcome: Not Progressing   Problem: Pain Managment: Goal: General experience of comfort will improve Outcome: Progressing

## 2018-05-13 NOTE — Progress Notes (Signed)
Pt discharged to home in stable condition, all discharge instructions and Rx's x3 reviewed with and given to pt, pt transported via wheelchair with belongings and transporters x 2 at chairside.  [id number]

## 2018-05-13 NOTE — Care Management Note (Signed)
Case Management Note  Patient Details  Name: BASSY FETTERLY MRN: [id number] Date of Birth: 1941-05-08  Subjective/Objective:                    Action/Plan: Per CM through the MD office patient has received her DME and [facility] PT is arranged through [facility]. Family to provide transportation home.   Expected Discharge Date:  05/13/18               Expected Discharge Plan:  [place]  In-House Referral:     Discharge planning Services  CM Consult  Post Acute Care Choice:    Choice offered to:     DME Arranged:    DME Agency:     HH Arranged:  PT [place]:  [facility])(Set up by MD office)  Status of Service:  Completed, signed off  If discussed at [place] of Stay Meetings, dates discussed:    Additional Comments:  Pollie Friar, RN 05/13/2018, 10:54 AM

## 2018-05-13 NOTE — Discharge Summary (Signed)
Patient ID: Rachel Ballard MRN: [id number] DOB/AGE: 78/11/1940 78 y.o.  Admit date: 05/11/2018 Discharge date: 05/13/2018  Admission Diagnoses:  Principal Problem:   Osteoarthritis of right knee Active Problems:   Primary osteoarthritis of right knee   Discharge Diagnoses:  Same  Past Medical History:  Diagnosis Date  . Arthritis   . GERD (gastroesophageal reflux disease)   . Hypertension   . Sleep apnea   . Umbilical hernia     Surgeries: Procedure(s): RIGHT TOTAL KNEE ARTHROPLASTY on 05/11/2018   Consultants:   Discharged Condition: Improved  Hospital Course: MAKESHA Ballard is an 77 y.o. female who was admitted 05/11/2018 for operative treatment ofOsteoarthritis of right knee. Patient has severe unremitting pain that affects sleep, daily activities, and work/hobbies. After pre-op clearance the patient was taken to the operating room on 05/11/2018 and underwent  Procedure(s): RIGHT TOTAL KNEE ARTHROPLASTY.    Patient was given perioperative antibiotics:  Anti-infectives (From admission, onward)   Start     Dose/Rate Route Frequency Ordered Stop   05/11/18 1130  vancomycin (VANCOCIN) IVPB 1000 mg/200 mL premix     1,000 mg 200 mL/hr over 60 Minutes Intravenous To ShortStay Surgical 05/10/18 0841 05/11/18 1330       Patient was given sequential compression devices, early ambulation, and chemoprophylaxis to prevent DVT.  Patient benefited maximally from hospital stay and there were no complications.    Recent vital signs:  Patient Vitals for the past 24 hrs:  BP Temp Temp src Pulse Resp SpO2  05/13/18 0434 (!) 132/50 98.3 F (36.8 C) Oral 79 14 97 %  05/12/18 2047 134/71 98.3 F (36.8 C) Oral 90 16 98 %  05/12/18 1753 (!) 133/56 98 F (36.7 C) Oral 80 16 98 %     Recent laboratory studies:  Recent Labs    05/12/18 0348 05/13/18 0414  WBC 16.8* 10.5  HGB 12.4 12.1  HCT 37.4 37.0  PLT 336 318  NA 140  --   K 3.9  --   CL 107  --   CO2 25  --   BUN 9  --    CREATININE 0.72  --   GLUCOSE 156*  --   CALCIUM 8.3*  --      Discharge Medications:   Allergies as of 05/13/2018      Reactions   Penicillins Hives, Itching, Rash   Has patient had a PCN reaction causing immediate rash, facial/tongue/throat swelling, SOB or lightheadedness with hypotension: Unknown Has patient had a PCN reaction causing severe rash involving mucus membranes or skin necrosis: Unknown Has patient had a PCN reaction that required hospitalization: No Has patient had a PCN reaction occurring within the last 10 years: No If all of the above answers are "NO", then may proceed with Cephalosporin use.   Erythromycin Hives, Rash      Medication List    STOP taking these medications   naproxen sodium 220 MG tablet Commonly known as:  ALEVE     TAKE these medications   aspirin EC 81 MG tablet Take 1 tablet (81 mg total) by mouth 2 (two) times daily. What changed:  when to take this   CALCIUM + D PO Take 1 tablet by mouth at bedtime.   carboxymethylcellulose 0.5 % Soln Commonly known as:  REFRESH PLUS Place 1 drop into both eyes daily.   REFRESH LIQUIGEL 1 % Gel Generic drug:  Carboxymethylcellulose Sodium Apply 1 application topically at bedtime.   docusate sodium 100 MG capsule  Commonly known as:  COLACE Take 100 mg by mouth 2 (two) times daily.   losartan 25 MG tablet Commonly known as:  COZAAR Take 25 mg by mouth daily.   multivitamin with minerals Tabs tablet Take 1 tablet by mouth daily. Women's One-A-Day 50+   Omega 3 1200 MG Caps Take 1,200 mg by mouth 2 (two) times daily.   oxyCODONE-acetaminophen 5-325 MG tablet Commonly known as:  PERCOCET/ROXICET Take 1 tablet by mouth every 4 (four) hours as needed for severe pain.   polyethylene glycol packet Commonly known as:  MIRALAX / GLYCOLAX Take 17 g by mouth daily.   tiZANidine 2 MG tablet Commonly known as:  ZANAFLEX Take 1 tablet (2 mg total) by mouth every 6 (six) hours as needed for  muscle spasms.            Durable Medical Equipment  (From admission, onward)         Start     Ordered   05/11/18 1718  DME Walker rolling  Once    Question:  Patient needs a walker to treat with the following condition  Answer:  Status post right knee replacement   05/11/18 1718   05/11/18 1718  DME 3 n 1  Once     05/11/18 1718           Discharge Care Instructions  (From admission, onward)         Start     Ordered   05/13/18 0000  Weight bearing as tolerated     05/13/18 0722          Diagnostic Studies: Dg Chest 2 View  Result Date: 05/02/2018 CLINICAL DATA:  None in PACs EXAM: CHEST - 2 VIEW COMPARISON:  The lungs are well-expanded and clear. The heart and pulmonary vascularity are normal. The mediastinum is normal in width. FINDINGS: The lungs are well-expanded and clear. The heart and pulmonary vascularity are normal. The mediastinum is normal in width. The trachea is midline. There is calcification in the wall of the aortic arch. The bony thorax is unremarkable. IMPRESSION: There is no active cardiopulmonary disease. Electronically Signed   By: David  Martinique M.D.   On: 05/02/2018 14:11    Disposition: Discharge disposition: 01-Home or Self Care       Discharge Instructions    Call MD / Call 911   Complete by:  As directed    If you experience chest pain or shortness of breath, CALL 911 and be transported to the hospital emergency room.  If you develope a fever above 101 F, pus (white drainage) or increased drainage or redness at the wound, or calf pain, call your surgeon's office.   Constipation Prevention   Complete by:  As directed    Drink plenty of fluids.  Prune juice may be helpful.  You may use a stool softener, such as Colace (over the counter) 100 mg twice a day.  Use MiraLax (over the counter) for constipation as needed.   Diet - low sodium heart healthy   Complete by:  As directed    Driving restrictions   Complete by:  As directed    No  driving for 2 weeks   Increase activity slowly as tolerated   Complete by:  As directed    Patient may shower   Complete by:  As directed    You may shower without a dressing once there is no drainage.  Do not wash over the wound.  If drainage remains,  cover wound with plastic wrap and then shower.   Weight bearing as tolerated   Complete by:  As directed       Follow-up Information    Frederik Pear, MD In 2 weeks.   Specialty:  Orthopedic Surgery Contact information: [place] [address] [phone]        [facility] Follow up.   Specialty:  [place] Why:  A representative from [facility] will contact you to arrange start date and time for your therapy. Contact information: [address] [place] [address] [phone]            Signed: Joanell Rising 05/13/2018, 7:22 AM

## 2018-05-14 DIAGNOSIS — Z79891 Long term (current) use of opiate analgesic: Secondary | ICD-10-CM | POA: Diagnosis not present

## 2018-05-14 DIAGNOSIS — M19071 Primary osteoarthritis, right ankle and foot: Secondary | ICD-10-CM | POA: Diagnosis not present

## 2018-05-14 DIAGNOSIS — I10 Essential (primary) hypertension: Secondary | ICD-10-CM | POA: Diagnosis not present

## 2018-05-14 DIAGNOSIS — G4730 Sleep apnea, unspecified: Secondary | ICD-10-CM | POA: Diagnosis not present

## 2018-05-14 DIAGNOSIS — Z471 Aftercare following joint replacement surgery: Secondary | ICD-10-CM | POA: Diagnosis not present

## 2018-05-14 DIAGNOSIS — K219 Gastro-esophageal reflux disease without esophagitis: Secondary | ICD-10-CM | POA: Diagnosis not present

## 2018-05-16 DIAGNOSIS — I10 Essential (primary) hypertension: Secondary | ICD-10-CM | POA: Diagnosis not present

## 2018-05-16 DIAGNOSIS — M19071 Primary osteoarthritis, right ankle and foot: Secondary | ICD-10-CM | POA: Diagnosis not present

## 2018-05-16 DIAGNOSIS — G4730 Sleep apnea, unspecified: Secondary | ICD-10-CM | POA: Diagnosis not present

## 2018-05-16 DIAGNOSIS — Z79891 Long term (current) use of opiate analgesic: Secondary | ICD-10-CM | POA: Diagnosis not present

## 2018-05-16 DIAGNOSIS — K219 Gastro-esophageal reflux disease without esophagitis: Secondary | ICD-10-CM | POA: Diagnosis not present

## 2018-05-16 DIAGNOSIS — Z471 Aftercare following joint replacement surgery: Secondary | ICD-10-CM | POA: Diagnosis not present

## 2018-05-18 DIAGNOSIS — G4730 Sleep apnea, unspecified: Secondary | ICD-10-CM | POA: Diagnosis not present

## 2018-05-18 DIAGNOSIS — M19071 Primary osteoarthritis, right ankle and foot: Secondary | ICD-10-CM | POA: Diagnosis not present

## 2018-05-18 DIAGNOSIS — Z471 Aftercare following joint replacement surgery: Secondary | ICD-10-CM | POA: Diagnosis not present

## 2018-05-18 DIAGNOSIS — Z79891 Long term (current) use of opiate analgesic: Secondary | ICD-10-CM | POA: Diagnosis not present

## 2018-05-18 DIAGNOSIS — K219 Gastro-esophageal reflux disease without esophagitis: Secondary | ICD-10-CM | POA: Diagnosis not present

## 2018-05-18 DIAGNOSIS — I10 Essential (primary) hypertension: Secondary | ICD-10-CM | POA: Diagnosis not present

## 2018-05-20 DIAGNOSIS — M19071 Primary osteoarthritis, right ankle and foot: Secondary | ICD-10-CM | POA: Diagnosis not present

## 2018-05-20 DIAGNOSIS — Z79891 Long term (current) use of opiate analgesic: Secondary | ICD-10-CM | POA: Diagnosis not present

## 2018-05-20 DIAGNOSIS — K219 Gastro-esophageal reflux disease without esophagitis: Secondary | ICD-10-CM | POA: Diagnosis not present

## 2018-05-20 DIAGNOSIS — Z471 Aftercare following joint replacement surgery: Secondary | ICD-10-CM | POA: Diagnosis not present

## 2018-05-20 DIAGNOSIS — G4730 Sleep apnea, unspecified: Secondary | ICD-10-CM | POA: Diagnosis not present

## 2018-05-20 DIAGNOSIS — I10 Essential (primary) hypertension: Secondary | ICD-10-CM | POA: Diagnosis not present

## 2018-05-23 DIAGNOSIS — M19071 Primary osteoarthritis, right ankle and foot: Secondary | ICD-10-CM | POA: Diagnosis not present

## 2018-05-23 DIAGNOSIS — Z471 Aftercare following joint replacement surgery: Secondary | ICD-10-CM | POA: Diagnosis not present

## 2018-05-23 DIAGNOSIS — G4730 Sleep apnea, unspecified: Secondary | ICD-10-CM | POA: Diagnosis not present

## 2018-05-23 DIAGNOSIS — I10 Essential (primary) hypertension: Secondary | ICD-10-CM | POA: Diagnosis not present

## 2018-05-23 DIAGNOSIS — Z79891 Long term (current) use of opiate analgesic: Secondary | ICD-10-CM | POA: Diagnosis not present

## 2018-05-23 DIAGNOSIS — K219 Gastro-esophageal reflux disease without esophagitis: Secondary | ICD-10-CM | POA: Diagnosis not present

## 2018-05-24 DIAGNOSIS — Z96651 Presence of right artificial knee joint: Secondary | ICD-10-CM | POA: Diagnosis not present

## 2018-05-24 DIAGNOSIS — Z471 Aftercare following joint replacement surgery: Secondary | ICD-10-CM | POA: Diagnosis not present

## 2018-05-24 DIAGNOSIS — M1711 Unilateral primary osteoarthritis, right knee: Secondary | ICD-10-CM | POA: Diagnosis not present

## 2018-05-24 DIAGNOSIS — M25661 Stiffness of right knee, not elsewhere classified: Secondary | ICD-10-CM | POA: Diagnosis not present

## 2018-05-24 DIAGNOSIS — M25561 Pain in right knee: Secondary | ICD-10-CM | POA: Diagnosis not present

## 2018-05-26 DIAGNOSIS — Z96651 Presence of right artificial knee joint: Secondary | ICD-10-CM | POA: Diagnosis not present

## 2018-05-26 DIAGNOSIS — M25661 Stiffness of right knee, not elsewhere classified: Secondary | ICD-10-CM | POA: Diagnosis not present

## 2018-05-26 DIAGNOSIS — M25561 Pain in right knee: Secondary | ICD-10-CM | POA: Diagnosis not present

## 2018-05-31 DIAGNOSIS — M25561 Pain in right knee: Secondary | ICD-10-CM | POA: Diagnosis not present

## 2018-05-31 DIAGNOSIS — Z96651 Presence of right artificial knee joint: Secondary | ICD-10-CM | POA: Diagnosis not present

## 2018-05-31 DIAGNOSIS — M25661 Stiffness of right knee, not elsewhere classified: Secondary | ICD-10-CM | POA: Diagnosis not present

## 2018-06-02 DIAGNOSIS — M25561 Pain in right knee: Secondary | ICD-10-CM | POA: Diagnosis not present

## 2018-06-02 DIAGNOSIS — M25661 Stiffness of right knee, not elsewhere classified: Secondary | ICD-10-CM | POA: Diagnosis not present

## 2018-06-02 DIAGNOSIS — Z96651 Presence of right artificial knee joint: Secondary | ICD-10-CM | POA: Diagnosis not present

## 2018-06-07 DIAGNOSIS — Z96651 Presence of right artificial knee joint: Secondary | ICD-10-CM | POA: Diagnosis not present

## 2018-06-07 DIAGNOSIS — M25561 Pain in right knee: Secondary | ICD-10-CM | POA: Diagnosis not present

## 2018-06-07 DIAGNOSIS — M25661 Stiffness of right knee, not elsewhere classified: Secondary | ICD-10-CM | POA: Diagnosis not present

## 2018-06-09 DIAGNOSIS — M25661 Stiffness of right knee, not elsewhere classified: Secondary | ICD-10-CM | POA: Diagnosis not present

## 2018-06-09 DIAGNOSIS — Z96651 Presence of right artificial knee joint: Secondary | ICD-10-CM | POA: Diagnosis not present

## 2018-06-09 DIAGNOSIS — M25561 Pain in right knee: Secondary | ICD-10-CM | POA: Diagnosis not present

## 2018-06-14 DIAGNOSIS — Z96651 Presence of right artificial knee joint: Secondary | ICD-10-CM | POA: Diagnosis not present

## 2018-06-14 DIAGNOSIS — M25661 Stiffness of right knee, not elsewhere classified: Secondary | ICD-10-CM | POA: Diagnosis not present

## 2018-06-14 DIAGNOSIS — M25561 Pain in right knee: Secondary | ICD-10-CM | POA: Diagnosis not present

## 2018-06-16 DIAGNOSIS — Z96651 Presence of right artificial knee joint: Secondary | ICD-10-CM | POA: Diagnosis not present

## 2018-06-16 DIAGNOSIS — M25661 Stiffness of right knee, not elsewhere classified: Secondary | ICD-10-CM | POA: Diagnosis not present

## 2018-06-16 DIAGNOSIS — M25561 Pain in right knee: Secondary | ICD-10-CM | POA: Diagnosis not present

## 2018-06-20 DIAGNOSIS — M25661 Stiffness of right knee, not elsewhere classified: Secondary | ICD-10-CM | POA: Diagnosis not present

## 2018-06-20 DIAGNOSIS — Z96651 Presence of right artificial knee joint: Secondary | ICD-10-CM | POA: Diagnosis not present

## 2018-06-20 DIAGNOSIS — M25561 Pain in right knee: Secondary | ICD-10-CM | POA: Diagnosis not present

## 2018-06-23 DIAGNOSIS — M25661 Stiffness of right knee, not elsewhere classified: Secondary | ICD-10-CM | POA: Diagnosis not present

## 2018-06-23 DIAGNOSIS — M25561 Pain in right knee: Secondary | ICD-10-CM | POA: Diagnosis not present

## 2018-06-23 DIAGNOSIS — Z96651 Presence of right artificial knee joint: Secondary | ICD-10-CM | POA: Diagnosis not present

## 2018-06-27 DIAGNOSIS — Z96651 Presence of right artificial knee joint: Secondary | ICD-10-CM | POA: Diagnosis not present

## 2018-06-27 DIAGNOSIS — M25561 Pain in right knee: Secondary | ICD-10-CM | POA: Diagnosis not present

## 2018-06-27 DIAGNOSIS — M25661 Stiffness of right knee, not elsewhere classified: Secondary | ICD-10-CM | POA: Diagnosis not present

## 2018-06-28 DIAGNOSIS — M25561 Pain in right knee: Secondary | ICD-10-CM | POA: Diagnosis not present

## 2018-07-04 DIAGNOSIS — M25561 Pain in right knee: Secondary | ICD-10-CM | POA: Diagnosis not present

## 2018-07-04 DIAGNOSIS — Z96651 Presence of right artificial knee joint: Secondary | ICD-10-CM | POA: Diagnosis not present

## 2018-07-04 DIAGNOSIS — M25661 Stiffness of right knee, not elsewhere classified: Secondary | ICD-10-CM | POA: Diagnosis not present

## 2018-08-15 DIAGNOSIS — H43813 Vitreous degeneration, bilateral: Secondary | ICD-10-CM | POA: Diagnosis not present

## 2018-08-15 DIAGNOSIS — H52203 Unspecified astigmatism, bilateral: Secondary | ICD-10-CM | POA: Diagnosis not present

## 2018-08-15 DIAGNOSIS — H04123 Dry eye syndrome of bilateral lacrimal glands: Secondary | ICD-10-CM | POA: Diagnosis not present

## 2018-08-15 DIAGNOSIS — H16103 Unspecified superficial keratitis, bilateral: Secondary | ICD-10-CM | POA: Diagnosis not present

## 2018-08-18 DIAGNOSIS — Z471 Aftercare following joint replacement surgery: Secondary | ICD-10-CM | POA: Diagnosis not present

## 2018-08-18 DIAGNOSIS — M25561 Pain in right knee: Secondary | ICD-10-CM | POA: Diagnosis not present

## 2018-08-18 DIAGNOSIS — Z96651 Presence of right artificial knee joint: Secondary | ICD-10-CM | POA: Diagnosis not present

## 2018-10-12 DIAGNOSIS — M25551 Pain in right hip: Secondary | ICD-10-CM | POA: Diagnosis not present

## 2018-10-12 DIAGNOSIS — M1711 Unilateral primary osteoarthritis, right knee: Secondary | ICD-10-CM | POA: Diagnosis not present

## 2018-10-12 DIAGNOSIS — M545 Low back pain: Secondary | ICD-10-CM | POA: Diagnosis not present

## 2018-10-17 DIAGNOSIS — M79604 Pain in right leg: Secondary | ICD-10-CM | POA: Diagnosis not present

## 2018-10-17 DIAGNOSIS — M545 Low back pain: Secondary | ICD-10-CM | POA: Diagnosis not present

## 2018-10-19 DIAGNOSIS — M545 Low back pain: Secondary | ICD-10-CM | POA: Diagnosis not present

## 2018-10-19 DIAGNOSIS — M79604 Pain in right leg: Secondary | ICD-10-CM | POA: Diagnosis not present

## 2018-10-24 DIAGNOSIS — M545 Low back pain: Secondary | ICD-10-CM | POA: Diagnosis not present

## 2018-10-24 DIAGNOSIS — M79604 Pain in right leg: Secondary | ICD-10-CM | POA: Diagnosis not present

## 2018-10-27 DIAGNOSIS — M79604 Pain in right leg: Secondary | ICD-10-CM | POA: Diagnosis not present

## 2018-10-27 DIAGNOSIS — M545 Low back pain: Secondary | ICD-10-CM | POA: Diagnosis not present

## 2018-10-31 DIAGNOSIS — M545 Low back pain: Secondary | ICD-10-CM | POA: Diagnosis not present

## 2018-10-31 DIAGNOSIS — M79604 Pain in right leg: Secondary | ICD-10-CM | POA: Diagnosis not present

## 2018-11-03 DIAGNOSIS — M545 Low back pain: Secondary | ICD-10-CM | POA: Diagnosis not present

## 2018-11-03 DIAGNOSIS — M79604 Pain in right leg: Secondary | ICD-10-CM | POA: Diagnosis not present

## 2018-11-07 DIAGNOSIS — M545 Low back pain: Secondary | ICD-10-CM | POA: Diagnosis not present

## 2018-11-07 DIAGNOSIS — M79604 Pain in right leg: Secondary | ICD-10-CM | POA: Diagnosis not present

## 2018-11-16 DIAGNOSIS — M545 Low back pain: Secondary | ICD-10-CM | POA: Diagnosis not present

## 2018-11-16 DIAGNOSIS — M79604 Pain in right leg: Secondary | ICD-10-CM | POA: Diagnosis not present

## 2018-11-23 DIAGNOSIS — M545 Low back pain: Secondary | ICD-10-CM | POA: Diagnosis not present

## 2018-11-23 DIAGNOSIS — M79604 Pain in right leg: Secondary | ICD-10-CM | POA: Diagnosis not present

## 2018-12-01 DIAGNOSIS — M79604 Pain in right leg: Secondary | ICD-10-CM | POA: Diagnosis not present

## 2018-12-01 DIAGNOSIS — M545 Low back pain: Secondary | ICD-10-CM | POA: Diagnosis not present

## 2018-12-08 DIAGNOSIS — M545 Low back pain: Secondary | ICD-10-CM | POA: Diagnosis not present

## 2018-12-08 DIAGNOSIS — M79604 Pain in right leg: Secondary | ICD-10-CM | POA: Diagnosis not present

## 2018-12-12 DIAGNOSIS — M79604 Pain in right leg: Secondary | ICD-10-CM | POA: Diagnosis not present

## 2018-12-12 DIAGNOSIS — M545 Low back pain: Secondary | ICD-10-CM | POA: Diagnosis not present

## 2018-12-27 DIAGNOSIS — M79604 Pain in right leg: Secondary | ICD-10-CM | POA: Diagnosis not present

## 2018-12-27 DIAGNOSIS — M545 Low back pain: Secondary | ICD-10-CM | POA: Diagnosis not present

## 2019-01-23 DIAGNOSIS — K5900 Constipation, unspecified: Secondary | ICD-10-CM | POA: Diagnosis not present

## 2019-01-23 DIAGNOSIS — Z5181 Encounter for therapeutic drug level monitoring: Secondary | ICD-10-CM | POA: Diagnosis not present

## 2019-01-23 DIAGNOSIS — I10 Essential (primary) hypertension: Secondary | ICD-10-CM | POA: Diagnosis not present

## 2019-01-23 DIAGNOSIS — G4730 Sleep apnea, unspecified: Secondary | ICD-10-CM | POA: Diagnosis not present

## 2019-01-23 DIAGNOSIS — Z1389 Encounter for screening for other disorder: Secondary | ICD-10-CM | POA: Diagnosis not present

## 2019-01-23 DIAGNOSIS — Z0000 Encounter for general adult medical examination without abnormal findings: Secondary | ICD-10-CM | POA: Diagnosis not present

## 2019-01-23 DIAGNOSIS — E7800 Pure hypercholesterolemia, unspecified: Secondary | ICD-10-CM | POA: Diagnosis not present

## 2019-01-24 DIAGNOSIS — I10 Essential (primary) hypertension: Secondary | ICD-10-CM | POA: Diagnosis not present

## 2019-01-24 DIAGNOSIS — Z79899 Other long term (current) drug therapy: Secondary | ICD-10-CM | POA: Diagnosis not present

## 2019-08-16 DIAGNOSIS — H43813 Vitreous degeneration, bilateral: Secondary | ICD-10-CM | POA: Diagnosis not present

## 2019-08-16 DIAGNOSIS — H52203 Unspecified astigmatism, bilateral: Secondary | ICD-10-CM | POA: Diagnosis not present

## 2019-08-16 DIAGNOSIS — H04123 Dry eye syndrome of bilateral lacrimal glands: Secondary | ICD-10-CM | POA: Diagnosis not present

## 2019-08-16 DIAGNOSIS — H16101 Unspecified superficial keratitis, right eye: Secondary | ICD-10-CM | POA: Diagnosis not present

## 2020-01-25 ENCOUNTER — Other Ambulatory Visit: Payer: Self-pay | Admitting: Internal Medicine

## 2020-01-25 DIAGNOSIS — E7800 Pure hypercholesterolemia, unspecified: Secondary | ICD-10-CM | POA: Diagnosis not present

## 2020-01-25 DIAGNOSIS — D696 Thrombocytopenia, unspecified: Secondary | ICD-10-CM | POA: Diagnosis not present

## 2020-01-25 DIAGNOSIS — M8580 Other specified disorders of bone density and structure, unspecified site: Secondary | ICD-10-CM | POA: Diagnosis not present

## 2020-01-25 DIAGNOSIS — Z0000 Encounter for general adult medical examination without abnormal findings: Secondary | ICD-10-CM | POA: Diagnosis not present

## 2020-01-25 DIAGNOSIS — G4730 Sleep apnea, unspecified: Secondary | ICD-10-CM | POA: Diagnosis not present

## 2020-01-25 DIAGNOSIS — Z1389 Encounter for screening for other disorder: Secondary | ICD-10-CM | POA: Diagnosis not present

## 2020-01-25 DIAGNOSIS — I10 Essential (primary) hypertension: Secondary | ICD-10-CM | POA: Diagnosis not present

## 2020-01-25 DIAGNOSIS — Z1231 Encounter for screening mammogram for malignant neoplasm of breast: Secondary | ICD-10-CM

## 2020-04-16 ENCOUNTER — Ambulatory Visit
Admission: RE | Admit: 2020-04-16 | Discharge: 2020-04-16 | Disposition: A | Discharge status: Home / Self Care | Payer: Medicare Other | Source: Ambulatory Visit | Attending: Internal Medicine | Admitting: Internal Medicine

## 2020-04-16 ENCOUNTER — Other Ambulatory Visit: Payer: Self-pay

## 2020-04-16 DIAGNOSIS — M85851 Other specified disorders of bone density and structure, right thigh: Secondary | ICD-10-CM | POA: Diagnosis not present

## 2020-04-16 DIAGNOSIS — Z1231 Encounter for screening mammogram for malignant neoplasm of breast: Secondary | ICD-10-CM

## 2020-04-16 DIAGNOSIS — M8580 Other specified disorders of bone density and structure, unspecified site: Secondary | ICD-10-CM

## 2020-04-16 DIAGNOSIS — Z780 Asymptomatic menopausal state: Secondary | ICD-10-CM | POA: Diagnosis not present

## 2020-04-19 ENCOUNTER — Other Ambulatory Visit: Payer: Self-pay | Admitting: Internal Medicine

## 2020-04-19 DIAGNOSIS — R928 Other abnormal and inconclusive findings on diagnostic imaging of breast: Secondary | ICD-10-CM

## 2020-05-01 ENCOUNTER — Other Ambulatory Visit: Payer: Self-pay

## 2020-05-01 ENCOUNTER — Ambulatory Visit
Admission: RE | Admit: 2020-05-01 | Discharge: 2020-05-01 | Disposition: A | Discharge status: Home / Self Care | Payer: Medicare Other | Source: Ambulatory Visit | Attending: Internal Medicine | Admitting: Internal Medicine

## 2020-05-01 DIAGNOSIS — R928 Other abnormal and inconclusive findings on diagnostic imaging of breast: Secondary | ICD-10-CM

## 2020-05-01 DIAGNOSIS — N6001 Solitary cyst of right breast: Secondary | ICD-10-CM | POA: Diagnosis not present

## 2020-08-12 DIAGNOSIS — H26491 Other secondary cataract, right eye: Secondary | ICD-10-CM | POA: Diagnosis not present

## 2020-08-12 DIAGNOSIS — H52203 Unspecified astigmatism, bilateral: Secondary | ICD-10-CM | POA: Diagnosis not present

## 2020-08-12 DIAGNOSIS — H43813 Vitreous degeneration, bilateral: Secondary | ICD-10-CM | POA: Diagnosis not present

## 2020-08-12 DIAGNOSIS — H04123 Dry eye syndrome of bilateral lacrimal glands: Secondary | ICD-10-CM | POA: Diagnosis not present

## 2020-10-03 DIAGNOSIS — H26491 Other secondary cataract, right eye: Secondary | ICD-10-CM | POA: Diagnosis not present

## 2021-03-18 ENCOUNTER — Other Ambulatory Visit: Payer: Self-pay | Admitting: Internal Medicine

## 2021-03-18 ENCOUNTER — Ambulatory Visit
Admission: RE | Admit: 2021-03-18 | Discharge: 2021-03-18 | Disposition: A | Discharge status: Home / Self Care | Payer: Medicare Other | Source: Ambulatory Visit | Attending: Internal Medicine | Admitting: Internal Medicine

## 2021-03-18 DIAGNOSIS — Z0000 Encounter for general adult medical examination without abnormal findings: Secondary | ICD-10-CM | POA: Diagnosis not present

## 2021-03-18 DIAGNOSIS — Z5181 Encounter for therapeutic drug level monitoring: Secondary | ICD-10-CM | POA: Diagnosis not present

## 2021-03-18 DIAGNOSIS — M545 Low back pain, unspecified: Secondary | ICD-10-CM | POA: Diagnosis not present

## 2021-03-18 DIAGNOSIS — E7800 Pure hypercholesterolemia, unspecified: Secondary | ICD-10-CM | POA: Diagnosis not present

## 2021-03-18 DIAGNOSIS — K5900 Constipation, unspecified: Secondary | ICD-10-CM | POA: Diagnosis not present

## 2021-03-18 DIAGNOSIS — I10 Essential (primary) hypertension: Secondary | ICD-10-CM | POA: Diagnosis not present

## 2021-03-18 DIAGNOSIS — M8580 Other specified disorders of bone density and structure, unspecified site: Secondary | ICD-10-CM | POA: Diagnosis not present

## 2021-03-18 DIAGNOSIS — Z1389 Encounter for screening for other disorder: Secondary | ICD-10-CM | POA: Diagnosis not present

## 2021-03-18 DIAGNOSIS — M47817 Spondylosis without myelopathy or radiculopathy, lumbosacral region: Secondary | ICD-10-CM | POA: Diagnosis not present

## 2021-04-17 DIAGNOSIS — M1712 Unilateral primary osteoarthritis, left knee: Secondary | ICD-10-CM | POA: Diagnosis not present

## 2021-04-17 DIAGNOSIS — M545 Low back pain, unspecified: Secondary | ICD-10-CM | POA: Diagnosis not present

## 2021-04-17 DIAGNOSIS — M5442 Lumbago with sciatica, left side: Secondary | ICD-10-CM | POA: Diagnosis not present

## 2021-04-17 DIAGNOSIS — M7062 Trochanteric bursitis, left hip: Secondary | ICD-10-CM | POA: Diagnosis not present

## 2021-05-15 ENCOUNTER — Other Ambulatory Visit: Payer: Self-pay | Admitting: Orthopedic Surgery

## 2021-05-15 DIAGNOSIS — M25552 Pain in left hip: Secondary | ICD-10-CM

## 2021-05-15 DIAGNOSIS — M545 Low back pain, unspecified: Secondary | ICD-10-CM

## 2021-05-15 DIAGNOSIS — M5442 Lumbago with sciatica, left side: Secondary | ICD-10-CM | POA: Diagnosis not present

## 2021-05-15 DIAGNOSIS — Z20822 Contact with and (suspected) exposure to covid-19: Secondary | ICD-10-CM | POA: Diagnosis not present

## 2021-05-22 DIAGNOSIS — M545 Low back pain, unspecified: Secondary | ICD-10-CM | POA: Diagnosis not present

## 2021-05-22 DIAGNOSIS — M5416 Radiculopathy, lumbar region: Secondary | ICD-10-CM | POA: Diagnosis not present

## 2021-05-26 DIAGNOSIS — M5416 Radiculopathy, lumbar region: Secondary | ICD-10-CM | POA: Diagnosis not present

## 2021-05-26 DIAGNOSIS — M545 Low back pain, unspecified: Secondary | ICD-10-CM | POA: Diagnosis not present

## 2021-05-28 DIAGNOSIS — M545 Low back pain, unspecified: Secondary | ICD-10-CM | POA: Diagnosis not present

## 2021-05-28 DIAGNOSIS — M5416 Radiculopathy, lumbar region: Secondary | ICD-10-CM | POA: Diagnosis not present

## 2021-05-31 ENCOUNTER — Other Ambulatory Visit: Payer: Self-pay

## 2021-05-31 ENCOUNTER — Ambulatory Visit
Admission: RE | Admit: 2021-05-31 | Discharge: 2021-05-31 | Disposition: A | Discharge status: Home / Self Care | Payer: Medicare Other | Source: Ambulatory Visit | Attending: Orthopedic Surgery | Admitting: Orthopedic Surgery

## 2021-05-31 DIAGNOSIS — R600 Localized edema: Secondary | ICD-10-CM | POA: Diagnosis not present

## 2021-05-31 DIAGNOSIS — M25552 Pain in left hip: Secondary | ICD-10-CM

## 2021-05-31 DIAGNOSIS — Z90710 Acquired absence of both cervix and uterus: Secondary | ICD-10-CM | POA: Diagnosis not present

## 2021-05-31 DIAGNOSIS — M1612 Unilateral primary osteoarthritis, left hip: Secondary | ICD-10-CM | POA: Diagnosis not present

## 2021-05-31 DIAGNOSIS — M48061 Spinal stenosis, lumbar region without neurogenic claudication: Secondary | ICD-10-CM | POA: Diagnosis not present

## 2021-05-31 DIAGNOSIS — M545 Low back pain, unspecified: Secondary | ICD-10-CM

## 2021-05-31 DIAGNOSIS — M25452 Effusion, left hip: Secondary | ICD-10-CM | POA: Diagnosis not present

## 2021-06-02 DIAGNOSIS — M5416 Radiculopathy, lumbar region: Secondary | ICD-10-CM | POA: Diagnosis not present

## 2021-06-02 DIAGNOSIS — M545 Low back pain, unspecified: Secondary | ICD-10-CM | POA: Diagnosis not present

## 2021-06-04 DIAGNOSIS — M5416 Radiculopathy, lumbar region: Secondary | ICD-10-CM | POA: Diagnosis not present

## 2021-06-04 DIAGNOSIS — M545 Low back pain, unspecified: Secondary | ICD-10-CM | POA: Diagnosis not present

## 2021-06-12 DIAGNOSIS — M545 Low back pain, unspecified: Secondary | ICD-10-CM | POA: Diagnosis not present

## 2021-06-12 DIAGNOSIS — M1612 Unilateral primary osteoarthritis, left hip: Secondary | ICD-10-CM | POA: Diagnosis not present

## 2021-06-13 DIAGNOSIS — M5416 Radiculopathy, lumbar region: Secondary | ICD-10-CM | POA: Diagnosis not present

## 2021-06-13 DIAGNOSIS — M545 Low back pain, unspecified: Secondary | ICD-10-CM | POA: Diagnosis not present

## 2021-06-30 DIAGNOSIS — M5416 Radiculopathy, lumbar region: Secondary | ICD-10-CM | POA: Diagnosis not present

## 2021-08-11 DIAGNOSIS — H524 Presbyopia: Secondary | ICD-10-CM | POA: Diagnosis not present

## 2021-08-11 DIAGNOSIS — Z961 Presence of intraocular lens: Secondary | ICD-10-CM | POA: Diagnosis not present

## 2021-09-03 IMAGING — CR DG ABDOMEN 1V
1 series · 1 of 1 positions shown · non-contrast
Comparison: None.

CLINICAL DATA: Constipation for 2 months, rectal pressure

EXAM:
ABDOMEN - 1 VIEW

[t abdomen supine]
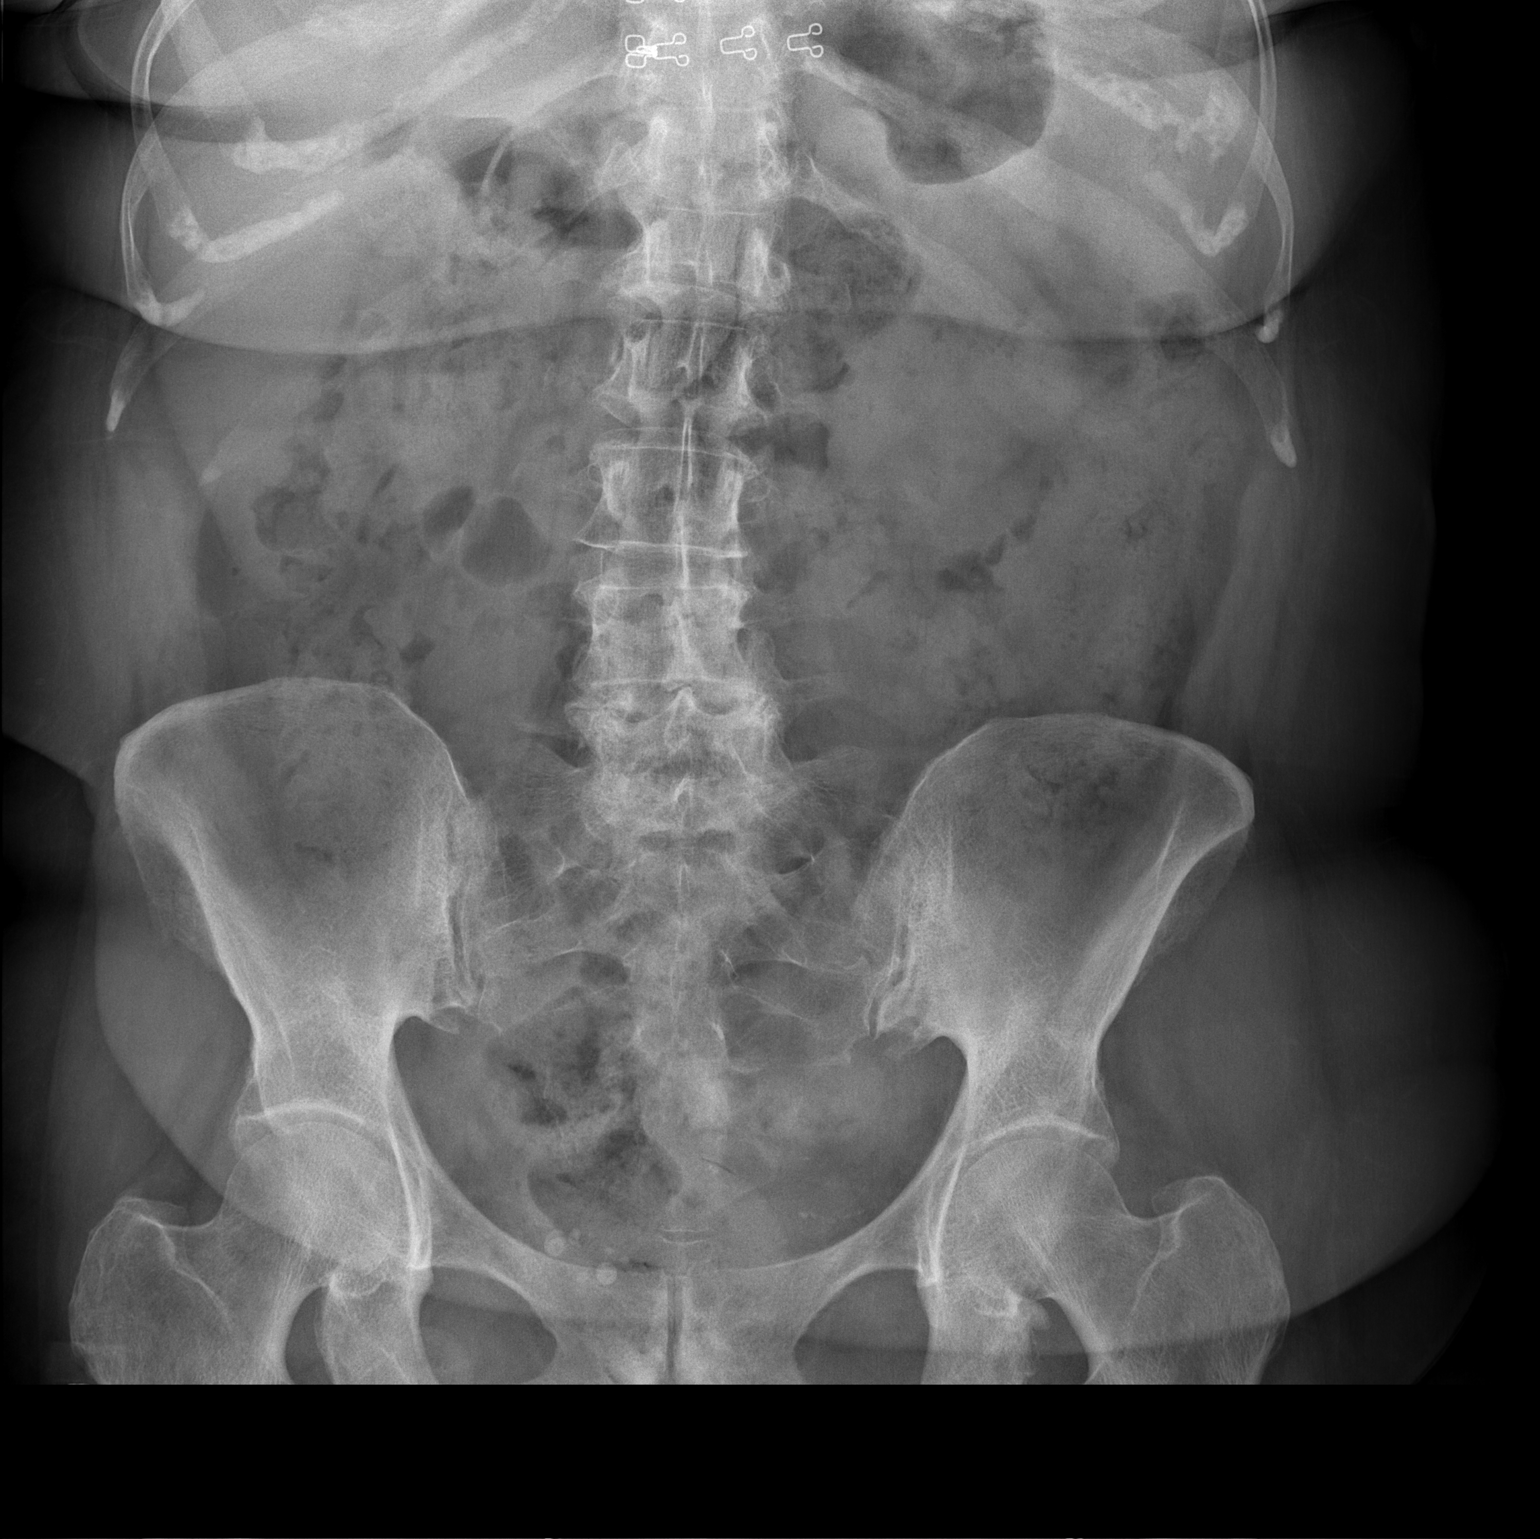

[1 of 1 positions shown; findings below may reference images not displayed]

FINDINGS: Supine frontal view of the abdomen and pelvis excludes the
hemidiaphragms by collimation. No bowel obstruction or ileus. There
is minimal stool throughout the colon, without significant fecal
retention. No masses or abnormal calcifications. No acute bony
abnormalities. Prominent degenerative changes at the lumbosacral
junction.
IMPRESSION: 1. Mild stool throughout the colon without significant fecal
retention. No obstruction.

## 2021-11-13 DIAGNOSIS — K5900 Constipation, unspecified: Secondary | ICD-10-CM | POA: Diagnosis not present

## 2021-12-18 DIAGNOSIS — L03114 Cellulitis of left upper limb: Secondary | ICD-10-CM | POA: Diagnosis not present

## 2021-12-26 DIAGNOSIS — M5442 Lumbago with sciatica, left side: Secondary | ICD-10-CM | POA: Diagnosis not present

## 2021-12-26 DIAGNOSIS — M25559 Pain in unspecified hip: Secondary | ICD-10-CM | POA: Diagnosis not present

## 2021-12-26 DIAGNOSIS — S40262D Insect bite (nonvenomous) of left shoulder, subsequent encounter: Secondary | ICD-10-CM | POA: Diagnosis not present

## 2021-12-26 DIAGNOSIS — G8929 Other chronic pain: Secondary | ICD-10-CM | POA: Diagnosis not present

## 2022-03-23 DIAGNOSIS — I10 Essential (primary) hypertension: Secondary | ICD-10-CM | POA: Diagnosis not present

## 2022-03-23 DIAGNOSIS — Z1331 Encounter for screening for depression: Secondary | ICD-10-CM | POA: Diagnosis not present

## 2022-03-23 DIAGNOSIS — Z0000 Encounter for general adult medical examination without abnormal findings: Secondary | ICD-10-CM | POA: Diagnosis not present

## 2022-03-23 DIAGNOSIS — L539 Erythematous condition, unspecified: Secondary | ICD-10-CM | POA: Diagnosis not present

## 2022-03-23 DIAGNOSIS — E7800 Pure hypercholesterolemia, unspecified: Secondary | ICD-10-CM | POA: Diagnosis not present

## 2022-03-23 DIAGNOSIS — D696 Thrombocytopenia, unspecified: Secondary | ICD-10-CM | POA: Diagnosis not present

## 2022-03-23 DIAGNOSIS — W57XXXA Bitten or stung by nonvenomous insect and other nonvenomous arthropods, initial encounter: Secondary | ICD-10-CM | POA: Diagnosis not present

## 2022-06-30 DIAGNOSIS — D2272 Melanocytic nevi of left lower limb, including hip: Secondary | ICD-10-CM | POA: Diagnosis not present

## 2022-06-30 DIAGNOSIS — D485 Neoplasm of uncertain behavior of skin: Secondary | ICD-10-CM | POA: Diagnosis not present

## 2022-06-30 DIAGNOSIS — L821 Other seborrheic keratosis: Secondary | ICD-10-CM | POA: Diagnosis not present

## 2022-06-30 DIAGNOSIS — D0462 Carcinoma in situ of skin of left upper limb, including shoulder: Secondary | ICD-10-CM | POA: Diagnosis not present

## 2022-06-30 DIAGNOSIS — D1801 Hemangioma of skin and subcutaneous tissue: Secondary | ICD-10-CM | POA: Diagnosis not present

## 2022-10-16 ENCOUNTER — Other Ambulatory Visit: Payer: Self-pay | Admitting: Physical Medicine and Rehabilitation

## 2022-10-16 DIAGNOSIS — M48062 Spinal stenosis, lumbar region with neurogenic claudication: Secondary | ICD-10-CM | POA: Diagnosis not present

## 2022-10-16 DIAGNOSIS — M48061 Spinal stenosis, lumbar region without neurogenic claudication: Secondary | ICD-10-CM

## 2022-10-16 DIAGNOSIS — M5416 Radiculopathy, lumbar region: Secondary | ICD-10-CM

## 2022-10-20 DIAGNOSIS — Z85828 Personal history of other malignant neoplasm of skin: Secondary | ICD-10-CM | POA: Diagnosis not present

## 2022-10-20 DIAGNOSIS — L821 Other seborrheic keratosis: Secondary | ICD-10-CM | POA: Diagnosis not present

## 2022-11-02 ENCOUNTER — Ambulatory Visit
Admission: RE | Admit: 2022-11-02 | Discharge: 2022-11-02 | Disposition: A | Discharge status: Home / Self Care | Payer: Medicare Other | Source: Ambulatory Visit | Attending: Physical Medicine and Rehabilitation | Admitting: Physical Medicine and Rehabilitation

## 2022-11-02 DIAGNOSIS — M48061 Spinal stenosis, lumbar region without neurogenic claudication: Secondary | ICD-10-CM

## 2022-11-02 DIAGNOSIS — M5416 Radiculopathy, lumbar region: Secondary | ICD-10-CM | POA: Diagnosis not present

## 2022-11-02 DIAGNOSIS — M47816 Spondylosis without myelopathy or radiculopathy, lumbar region: Secondary | ICD-10-CM | POA: Diagnosis not present

## 2022-11-02 DIAGNOSIS — M4316 Spondylolisthesis, lumbar region: Secondary | ICD-10-CM | POA: Diagnosis not present

## 2022-11-06 DIAGNOSIS — M5416 Radiculopathy, lumbar region: Secondary | ICD-10-CM | POA: Diagnosis not present

## 2022-11-17 DIAGNOSIS — M5416 Radiculopathy, lumbar region: Secondary | ICD-10-CM | POA: Diagnosis not present

## 2022-11-24 DIAGNOSIS — M48062 Spinal stenosis, lumbar region with neurogenic claudication: Secondary | ICD-10-CM | POA: Diagnosis not present

## 2022-11-24 DIAGNOSIS — M4316 Spondylolisthesis, lumbar region: Secondary | ICD-10-CM | POA: Diagnosis not present

## 2022-12-01 DIAGNOSIS — M4316 Spondylolisthesis, lumbar region: Secondary | ICD-10-CM | POA: Diagnosis not present

## 2022-12-01 DIAGNOSIS — M48062 Spinal stenosis, lumbar region with neurogenic claudication: Secondary | ICD-10-CM | POA: Diagnosis not present

## 2022-12-03 DIAGNOSIS — M48062 Spinal stenosis, lumbar region with neurogenic claudication: Secondary | ICD-10-CM | POA: Diagnosis not present

## 2022-12-03 DIAGNOSIS — M4316 Spondylolisthesis, lumbar region: Secondary | ICD-10-CM | POA: Diagnosis not present

## 2022-12-08 DIAGNOSIS — M48062 Spinal stenosis, lumbar region with neurogenic claudication: Secondary | ICD-10-CM | POA: Diagnosis not present

## 2022-12-08 DIAGNOSIS — M4316 Spondylolisthesis, lumbar region: Secondary | ICD-10-CM | POA: Diagnosis not present

## 2022-12-10 DIAGNOSIS — M4316 Spondylolisthesis, lumbar region: Secondary | ICD-10-CM | POA: Diagnosis not present

## 2022-12-10 DIAGNOSIS — M48062 Spinal stenosis, lumbar region with neurogenic claudication: Secondary | ICD-10-CM | POA: Diagnosis not present

## 2022-12-14 DIAGNOSIS — H524 Presbyopia: Secondary | ICD-10-CM | POA: Diagnosis not present

## 2022-12-14 DIAGNOSIS — H04123 Dry eye syndrome of bilateral lacrimal glands: Secondary | ICD-10-CM | POA: Diagnosis not present

## 2022-12-15 DIAGNOSIS — M4316 Spondylolisthesis, lumbar region: Secondary | ICD-10-CM | POA: Diagnosis not present

## 2022-12-15 DIAGNOSIS — M48062 Spinal stenosis, lumbar region with neurogenic claudication: Secondary | ICD-10-CM | POA: Diagnosis not present

## 2022-12-17 DIAGNOSIS — M48062 Spinal stenosis, lumbar region with neurogenic claudication: Secondary | ICD-10-CM | POA: Diagnosis not present

## 2022-12-17 DIAGNOSIS — M4316 Spondylolisthesis, lumbar region: Secondary | ICD-10-CM | POA: Diagnosis not present

## 2022-12-21 DIAGNOSIS — M48062 Spinal stenosis, lumbar region with neurogenic claudication: Secondary | ICD-10-CM | POA: Diagnosis not present

## 2022-12-21 DIAGNOSIS — M4316 Spondylolisthesis, lumbar region: Secondary | ICD-10-CM | POA: Diagnosis not present

## 2023-04-21 DIAGNOSIS — K429 Umbilical hernia without obstruction or gangrene: Secondary | ICD-10-CM | POA: Diagnosis not present

## 2023-04-21 DIAGNOSIS — D696 Thrombocytopenia, unspecified: Secondary | ICD-10-CM | POA: Diagnosis not present

## 2023-04-21 DIAGNOSIS — Z1331 Encounter for screening for depression: Secondary | ICD-10-CM | POA: Diagnosis not present

## 2023-04-21 DIAGNOSIS — I10 Essential (primary) hypertension: Secondary | ICD-10-CM | POA: Diagnosis not present

## 2023-04-21 DIAGNOSIS — G4730 Sleep apnea, unspecified: Secondary | ICD-10-CM | POA: Diagnosis not present

## 2023-04-21 DIAGNOSIS — Z23 Encounter for immunization: Secondary | ICD-10-CM | POA: Diagnosis not present

## 2023-04-21 DIAGNOSIS — Z5181 Encounter for therapeutic drug level monitoring: Secondary | ICD-10-CM | POA: Diagnosis not present

## 2023-04-21 DIAGNOSIS — E7800 Pure hypercholesterolemia, unspecified: Secondary | ICD-10-CM | POA: Diagnosis not present

## 2023-04-21 DIAGNOSIS — M48061 Spinal stenosis, lumbar region without neurogenic claudication: Secondary | ICD-10-CM | POA: Diagnosis not present

## 2023-04-21 DIAGNOSIS — Z0000 Encounter for general adult medical examination without abnormal findings: Secondary | ICD-10-CM | POA: Diagnosis not present

## 2023-06-11 ENCOUNTER — Ambulatory Visit
Admission: EM | Admit: 2023-06-11 | Discharge: 2023-06-11 | Disposition: A | Discharge status: Home / Self Care | Payer: Medicare Other

## 2023-06-11 ENCOUNTER — Telehealth: Payer: Self-pay

## 2023-06-11 DIAGNOSIS — H65192 Other acute nonsuppurative otitis media, left ear: Secondary | ICD-10-CM | POA: Diagnosis not present

## 2023-06-11 DIAGNOSIS — U071 COVID-19: Secondary | ICD-10-CM | POA: Diagnosis not present

## 2023-06-11 MED ORDER — DOXYCYCLINE HYCLATE 100 MG PO CAPS: 100.0000 mg | ORAL_CAPSULE | Freq: Two times a day (BID) | ORAL | 0 refills | Status: AC

## 2023-06-11 MED ORDER — DOXYCYCLINE HYCLATE 100 MG PO CAPS: 100.0000 mg | ORAL_CAPSULE | Freq: Two times a day (BID) | ORAL | 0 refills | Status: DC

## 2023-06-11 NOTE — ED Provider Notes (Signed)
[facility]    CSN: [id number] Arrival date & time: 06/11/23  1229      History   Chief Complaint Chief Complaint  Patient presents with   Covid Positive   Otalgia    HPI Rachel Ballard is a 82 y.o. female.   Patient here today for evaluation of congestion, cough, sneezing and left ear pain that started about 3 days ago.  She states that she tested positive for COVID yesterday.  She is unsure how high fever has been but states she thinks she has had fever in the evenings given chills.  She denies any vomiting or diarrhea.  Patient has had Tylenol which has been helpful.  The history is provided by the patient.  Otalgia Associated symptoms: congestion, cough and fever   Associated symptoms: no abdominal pain, no diarrhea, no sore throat and no vomiting     Past Medical History:  Diagnosis Date   Arthritis    GERD (gastroesophageal reflux disease)    Hypertension    Sleep apnea    Umbilical hernia     Patient Active Problem List   Diagnosis Date Noted   Primary osteoarthritis of right knee 05/11/2018   Osteoarthritis of right knee 05/10/2018   Arthritis of right subtalar joint 03/15/2017    Past Surgical History:  Procedure Laterality Date   ABDOMINAL HYSTERECTOMY     partial   COLONOSCOPY WITH PROPOFOL N/A 09/18/2014   Procedure: COLONOSCOPY WITH PROPOFOL;  Surgeon: Charolett Bumpers, MD;  Location: [facility];  Service: Endoscopy;  Laterality: N/A;   EYE SURGERY     cataract surgery left eye with lens implant   FOOT SURGERY Right 1986   crushed heel; from a car surgery.    TOTAL KNEE ARTHROPLASTY Right 05/11/2018   Procedure: RIGHT TOTAL KNEE ARTHROPLASTY;  Surgeon: Gean Birchwood, MD;  Location: [facility] OR;  Service: Orthopedics;  Laterality: Right;    OB History   No obstetric history on file.      Home Medications    Prior to Admission medications   Medication Sig Start Date End Date Taking? Authorizing Provider  aspirin EC 81 MG tablet  Take 1 tablet (81 mg total) by mouth 2 (two) times daily. 05/11/18  Yes Allena Katz, PA-C  Calcium Carbonate-Vitamin D (CALCIUM + D PO) Take 1 tablet by mouth at bedtime.    Yes [provider]  carboxymethylcellulose (REFRESH PLUS) 0.5 % SOLN Place 1 drop into both eyes daily.   Yes [provider]  Carboxymethylcellulose Sodium (REFRESH LIQUIGEL) 1 % GEL Apply 1 application topically at bedtime.   Yes [provider]  docusate sodium (COLACE) 100 MG capsule Take 100 mg by mouth 2 (two) times daily.   Yes [provider]  doxycycline (VIBRAMYCIN) 100 MG capsule Take 1 capsule (100 mg total) by mouth 2 (two) times daily. 06/11/23  Yes Tomi Bamberger, PA-C  losartan (COZAAR) 25 MG tablet Take 25 mg by mouth daily. 04/27/18  Yes [provider]  Multiple Vitamin (MULTIVITAMIN WITH MINERALS) TABS tablet Take 1 tablet by mouth daily. Women's One-A-Day 50+   Yes [provider]  Omega 3 1200 MG CAPS Take 1,200 mg by mouth 2 (two) times daily.    Yes [provider]  Turmeric 500 MG CAPS 1 capsule Orally once a day   Yes [provider]  oxyCODONE-acetaminophen (PERCOCET/ROXICET) 5-325 MG tablet Take 1 tablet by mouth every 4 (four) hours as needed for severe pain. 05/11/18  Dannielle Burn K, PA-C  polyethylene glycol (MIRALAX / GLYCOLAX) packet Take 17 g by mouth daily.    [provider]  tiZANidine (ZANAFLEX) 2 MG tablet Take 1 tablet (2 mg total) by mouth every 6 (six) hours as needed for muscle spasms. 05/11/18   Allena Katz, PA-C    Family History History reviewed. No pertinent family history.  Social History Social History   Tobacco Use   Smoking status: Never   Smokeless tobacco: Never  Substance Use Topics   Alcohol use: No   Drug use: No     Allergies   Penicillins and Ak-mycin [erythromycin]   Review of Systems Review of Systems  Constitutional:  Positive for chills and fever.  HENT:   Positive for congestion and ear pain. Negative for sore throat.   Eyes:  Negative for discharge and redness.  Respiratory:  Positive for cough. Negative for shortness of breath and wheezing.   Gastrointestinal:  Negative for abdominal pain, diarrhea, nausea and vomiting.     Physical Exam Triage Vital Signs ED Triage Vitals  Encounter Vitals Group     BP 06/11/23 1245 (!) 181/80     Systolic BP Percentile --      Diastolic BP Percentile --      Pulse Rate 06/11/23 1245 (!) 105     Resp 06/11/23 1245 19     Temp 06/11/23 1245 98.3 F (36.8 C)     Temp Source 06/11/23 1245 Oral     SpO2 06/11/23 1245 94 %     Weight 06/11/23 1243 170 lb (77.1 kg)     Height --      Head Circumference --      Peak Flow --      Pain Score 06/11/23 1243 0     Pain Loc --      Pain Education --      Exclude from Growth Chart --    No data found.  Updated Vital Signs BP (!) 181/80 (BP Location: Left Arm)   Pulse (!) 105   Temp 98.3 F (36.8 C) (Oral)   Resp 19   Wt 170 lb (77.1 kg)   SpO2 94%   BMI 32.12 kg/m     Physical Exam Vitals and nursing note reviewed.  Constitutional:      General: She is not in acute distress.    Appearance: Normal appearance. She is not ill-appearing.  HENT:     Head: Normocephalic and atraumatic.     Right Ear: Tympanic membrane normal.     Ears:     Comments: Left TM erythematous    Nose: Congestion present.     Mouth/Throat:     Mouth: Mucous membranes are moist.     Pharynx: No oropharyngeal exudate or posterior oropharyngeal erythema.  Eyes:     Conjunctiva/sclera: Conjunctivae normal.  Cardiovascular:     Rate and Rhythm: Normal rate and regular rhythm.     Heart sounds: Normal heart sounds. No murmur heard. Pulmonary:     Effort: Pulmonary effort is normal. No respiratory distress.     Breath sounds: Normal breath sounds. No wheezing, rhonchi or rales.  Skin:    General: Skin is warm and dry.  Neurological:     Mental Status: She is  alert.  Psychiatric:        Mood and Affect: Mood normal.        Thought Content: Thought content normal.      UC Treatments / Results  Labs (all labs ordered are listed, but only abnormal results are displayed) Labs Reviewed - No data to display  EKG   Radiology No results found.  Procedures Procedures (including critical care time)  Medications Ordered in UC Medications - No data to display  Initial Impression / Assessment and Plan / UC Course  I have reviewed the triage vital signs and the nursing notes.  Pertinent labs & imaging results that were available during my care of the patient were reviewed by me and considered in my medical decision making (see chart for details).    Doxycycline prescribed to cover suspected otitis media.  Discussed antiviral therapy but given mild symptoms and duration with no kidney function screening recommended we hold antiviral medication at this time.  Patient is agreeable.  Encouraged follow-up if no gradual improvement with any worsening symptoms.  Final Clinical Impressions(s) / UC Diagnoses   Final diagnoses:  COVID-19  Other acute nonsuppurative otitis media of left ear, recurrence not specified   Discharge Instructions   None    ED Prescriptions     Medication Sig Dispense Auth. Provider   doxycycline (VIBRAMYCIN) 100 MG capsule Take 1 capsule (100 mg total) by mouth 2 (two) times daily. 20 capsule Tomi Bamberger, PA-C      PDMP not reviewed this encounter.   Tomi Bamberger, PA-C 06/11/23 1325

## 2023-06-11 NOTE — Telephone Encounter (Signed)
Pt called requested medication be sent to the [facility] on [address].

## 2023-06-11 NOTE — ED Triage Notes (Signed)
Patient tested positive for covid yesterday. Sx started Wednesday. Coughing, sneezing,fever. Left ear pain

## 2023-07-06 ENCOUNTER — Other Ambulatory Visit: Payer: Self-pay | Admitting: Internal Medicine

## 2023-07-06 ENCOUNTER — Ambulatory Visit
Admission: RE | Admit: 2023-07-06 | Discharge: 2023-07-06 | Disposition: A | Discharge status: Home / Self Care | Payer: Medicare Other | Source: Ambulatory Visit | Attending: Internal Medicine | Admitting: Internal Medicine

## 2023-07-06 DIAGNOSIS — K5900 Constipation, unspecified: Secondary | ICD-10-CM

## 2023-07-06 DIAGNOSIS — R109 Unspecified abdominal pain: Secondary | ICD-10-CM | POA: Diagnosis not present

## 2023-08-04 DIAGNOSIS — K5900 Constipation, unspecified: Secondary | ICD-10-CM | POA: Diagnosis not present

## 2024-01-03 DIAGNOSIS — E7800 Pure hypercholesterolemia, unspecified: Secondary | ICD-10-CM | POA: Diagnosis not present

## 2024-01-03 DIAGNOSIS — I10 Essential (primary) hypertension: Secondary | ICD-10-CM | POA: Diagnosis not present

## 2024-01-04 DIAGNOSIS — I10 Essential (primary) hypertension: Secondary | ICD-10-CM | POA: Diagnosis not present

## 2024-02-02 DIAGNOSIS — I10 Essential (primary) hypertension: Secondary | ICD-10-CM | POA: Diagnosis not present

## 2024-02-02 DIAGNOSIS — E7800 Pure hypercholesterolemia, unspecified: Secondary | ICD-10-CM | POA: Diagnosis not present

## 2024-03-03 DIAGNOSIS — I10 Essential (primary) hypertension: Secondary | ICD-10-CM | POA: Diagnosis not present

## 2024-03-04 DIAGNOSIS — E7800 Pure hypercholesterolemia, unspecified: Secondary | ICD-10-CM | POA: Diagnosis not present

## 2024-03-04 DIAGNOSIS — I10 Essential (primary) hypertension: Secondary | ICD-10-CM | POA: Diagnosis not present

## 2024-05-25 DIAGNOSIS — M8580 Other specified disorders of bone density and structure, unspecified site: Secondary | ICD-10-CM | POA: Diagnosis not present

## 2024-05-25 DIAGNOSIS — G4730 Sleep apnea, unspecified: Secondary | ICD-10-CM | POA: Diagnosis not present

## 2024-05-25 DIAGNOSIS — M48061 Spinal stenosis, lumbar region without neurogenic claudication: Secondary | ICD-10-CM | POA: Diagnosis not present

## 2024-05-25 DIAGNOSIS — I10 Essential (primary) hypertension: Secondary | ICD-10-CM | POA: Diagnosis not present

## 2024-05-25 DIAGNOSIS — Z1331 Encounter for screening for depression: Secondary | ICD-10-CM | POA: Diagnosis not present

## 2024-05-25 DIAGNOSIS — Z0000 Encounter for general adult medical examination without abnormal findings: Secondary | ICD-10-CM | POA: Diagnosis not present

## 2024-05-25 DIAGNOSIS — Z5181 Encounter for therapeutic drug level monitoring: Secondary | ICD-10-CM | POA: Diagnosis not present

## 2024-05-25 DIAGNOSIS — E7800 Pure hypercholesterolemia, unspecified: Secondary | ICD-10-CM | POA: Diagnosis not present
